# Patient Record
Sex: Female | Born: 1960
Health system: Southern US, Community
[De-identification: ages and names within clinical notes are randomized; demographics above are authoritative.]

## PROBLEM LIST (undated history)

## (undated) DIAGNOSIS — D649 Anemia, unspecified: Secondary | ICD-10-CM

## (undated) DIAGNOSIS — B009 Herpesviral infection, unspecified: Secondary | ICD-10-CM

## (undated) DIAGNOSIS — F32A Depression, unspecified: Secondary | ICD-10-CM

## (undated) DIAGNOSIS — M1990 Unspecified osteoarthritis, unspecified site: Secondary | ICD-10-CM

## (undated) DIAGNOSIS — F419 Anxiety disorder, unspecified: Secondary | ICD-10-CM

## (undated) HISTORY — PX: CHOLECYSTECTOMY: SHX55

## (undated) HISTORY — PX: LEG SURGERY: SHX1003

---

## 2016-10-25 ENCOUNTER — Encounter (HOSPITAL_COMMUNITY): Payer: Self-pay | Admitting: Emergency Medicine

## 2016-10-25 ENCOUNTER — Emergency Department (HOSPITAL_COMMUNITY): Payer: Commercial Managed Care - PPO

## 2016-10-25 ENCOUNTER — Emergency Department (HOSPITAL_COMMUNITY)
Admission: EM | Admit: 2016-10-25 | Discharge: 2016-10-25 | Disposition: A | Discharge status: Home / Self Care | Payer: Commercial Managed Care - PPO | Attending: Emergency Medicine | Admitting: Emergency Medicine

## 2016-10-25 DIAGNOSIS — J069 Acute upper respiratory infection, unspecified: Secondary | ICD-10-CM | POA: Diagnosis not present

## 2016-10-25 DIAGNOSIS — R05 Cough: Secondary | ICD-10-CM | POA: Diagnosis present

## 2016-10-25 NOTE — ED Provider Notes (Signed)
[facility] DEPT Provider Note   CSN: [id number] Arrival date & time: 10/25/16  1437     History   Chief Complaint Chief Complaint  Patient presents with  . Nasal Congestion    HPI Felicia Daniel is a 55 y.o. female.  HPI 55 year old female who presents with cough and congestion. No significant PMH. Works at [employer] and exposed to sick contacts. Yesterday, developed cough productive of thick mucous, sinus pressure and congestion, nasal congestion and drainage and left ear pain. No fever or chills, nausea or vomiting, diarrhea, abdominal pain or chest pain. Has not tried medications prior to arrival.  History reviewed. No pertinent past medical history.  There are no active problems to display for this patient.   Past Surgical History:  Procedure Laterality Date  . LEG SURGERY      OB History    No data available       Home Medications    Prior to Admission medications   Medication Sig Start Date End Date Taking? Authorizing Provider  Throat Lozenges (COUGH DROPS MT) Use as directed 1 drop in the mouth or throat as needed.   Yes Historical Provider, MD    Family History No family history on file. Reviewed. Non-contributory.   Social History Social History  Substance Use Topics  . Smoking status: Never Smoker  . Smokeless tobacco: Never Used  . Alcohol use No     Allergies   Review of patient's allergies indicates not on file.   Review of Systems Review of Systems  Constitutional: Negative for fever.  HENT: Positive for congestion, ear pain, rhinorrhea, sinus pressure and sore throat.   Respiratory: Positive for cough.   Cardiovascular: Negative for chest pain.  Gastrointestinal: Negative for vomiting.  All other systems reviewed and are negative.    Physical Exam Updated Vital Signs BP 134/87 (BP Location: Left Arm)   Pulse 80   Temp 98.2 F (36.8 C) (Oral)   Resp 20   Ht 5\' 4"  (1.626 m)   Wt 170 lb (77.1 kg)   LMP  10/21/2016   SpO2 98%   BMI 29.18 kg/m   Physical Exam Physical Exam  Nursing note and vitals reviewed. Constitutional: Well developed, well nourished, non-toxic, and in no acute distress Head: Normocephalic and atraumatic. Pain to percussion of bilateral frontal and maxillary sinuses. Ears: Left TM bulging with middle ear clear effusion. Normal right ear.  Mouth/Throat: Oropharynx is clear and moist.  Neck: Normal range of motion. Neck supple.  Cardiovascular: Normal rate and regular rhythm.   Pulmonary/Chest: Effort normal and breath sounds normal.  Abdominal: Soft. There is no tenderness. There is no rebound and no guarding.  Musculoskeletal: Normal range of motion.  Neurological: Alert, no facial droop, fluent speech, moves all extremities symmetrically Skin: Skin is warm and dry.  Psychiatric: Cooperative    ED Treatments / Results  Labs (all labs ordered are listed, but only abnormal results are displayed) Labs Reviewed - No data to display  EKG  EKG Interpretation None       Radiology Dg Chest 2 View  Result Date: 10/25/2016 CLINICAL DATA:  One day history of cough and chest congestion with sinus pressure. EXAM: CHEST  2 VIEW COMPARISON:  None in PACs FINDINGS: The lungs are well-expanded and clear. The heart and pulmonary vascularity are normal. The mediastinum is normal in width. There is no pleural effusion or pneumothorax. There is very mild anterior wedging of the body of T12. IMPRESSION:  There is no active cardiopulmonary disease. Mild loss of height of the body of [id number] which is of uncertain age. Electronically Signed   By: David  Martinique M.D.   On: 10/25/2016 15:39    Procedures Procedures (including critical care time)  Medications Ordered in ED Medications - No data to display   Initial Impression / Assessment and Plan / ED Course  I have reviewed the triage vital signs and the nursing notes.  Pertinent labs & imaging results that were available during  my care of the patient were reviewed by me and considered in my medical decision making (see chart for details).  Clinical Course   55 year old female who presents with likely viral upper respiratory infection. CXR Visualized and without any acute cardiopulmonary processes. Discussed supportive care instructions for decongestants and OTC analgesics. Strict return and follow-up instructions reviewed. She expressed understanding of all discharge instructions and felt comfortable with the plan of care.   Final Clinical Impressions(s) / ED Diagnoses   Final diagnoses:  Viral upper respiratory infection    New Prescriptions New Prescriptions   No medications on file     Forde Dandy, MD 10/25/16 1558

## 2016-10-25 NOTE — Discharge Instructions (Signed)
Your Chest x-ray does show pneumonia. He likely has a viral respiratory illness. Please follow up closely with her primary care doctor. Take over-the-counter Tylenol or ibuprofen for pain and nasal decongestants as needed. Return without fail for worsening symptoms, including confusion, difficulty breathing, escalating pain, or any other symptoms concerning to you.

## 2016-10-25 NOTE — ED Triage Notes (Signed)
Onset last night, facial pain, cough with brown mucus, sore throat and left ear pain

## 2019-06-29 ENCOUNTER — Telehealth: Payer: Self-pay | Admitting: Orthopaedic Surgery

## 2019-06-29 NOTE — Telephone Encounter (Signed)
Patient called and left voice message regarding scheduling an appointment.  Called back to number left on message, ph# [phone]; patient has no voice mail set up; unable to leave message.

## 2019-06-30 NOTE — Telephone Encounter (Signed)
No return call as of today.

## 2019-07-25 ENCOUNTER — Emergency Department (HOSPITAL_COMMUNITY)
Admission: EM | Admit: 2019-07-25 | Discharge: 2019-07-26 | Disposition: A | Discharge status: Home / Self Care | Payer: Commercial Managed Care - PPO | Attending: Emergency Medicine | Admitting: Emergency Medicine

## 2019-07-25 ENCOUNTER — Encounter (HOSPITAL_COMMUNITY): Payer: Self-pay | Admitting: Emergency Medicine

## 2019-07-25 ENCOUNTER — Emergency Department (HOSPITAL_COMMUNITY): Payer: Commercial Managed Care - PPO

## 2019-07-25 ENCOUNTER — Other Ambulatory Visit: Payer: Self-pay

## 2019-07-25 DIAGNOSIS — R509 Fever, unspecified: Secondary | ICD-10-CM

## 2019-07-25 DIAGNOSIS — Z20828 Contact with and (suspected) exposure to other viral communicable diseases: Secondary | ICD-10-CM | POA: Insufficient documentation

## 2019-07-25 DIAGNOSIS — N3000 Acute cystitis without hematuria: Secondary | ICD-10-CM

## 2019-07-25 HISTORY — DX: Herpesviral infection, unspecified: B00.9

## 2019-07-25 LAB — CBC WITH DIFFERENTIAL/PLATELET
Abs Immature Granulocytes: 0.05 10*3/uL (ref 0.00–0.07)
Basophils Absolute: 0 10*3/uL (ref 0.0–0.1)
Basophils Relative: 0 %
Eosinophils Absolute: 0 10*3/uL (ref 0.0–0.5)
Eosinophils Relative: 0 %
HCT: 43 % (ref 36.0–46.0)
Hemoglobin: 13.9 g/dL (ref 12.0–15.0)
Immature Granulocytes: 0 %
Lymphocytes Relative: 11 %
Lymphs Abs: 1.5 10*3/uL (ref 0.7–4.0)
MCH: 27.8 pg (ref 26.0–34.0)
MCHC: 32.3 g/dL (ref 30.0–36.0)
MCV: 86 fL (ref 80.0–100.0)
Monocytes Absolute: 0.8 10*3/uL (ref 0.1–1.0)
Monocytes Relative: 6 %
Neutro Abs: 10.5 10*3/uL — ABNORMAL HIGH (ref 1.7–7.7)
Neutrophils Relative %: 83 %
Platelets: 191 10*3/uL (ref 150–400)
RBC: 5 MIL/uL (ref 3.87–5.11)
RDW: 12.8 % (ref 11.5–15.5)
WBC: 12.8 10*3/uL — ABNORMAL HIGH (ref 4.0–10.5)
nRBC: 0 % (ref 0.0–0.2)

## 2019-07-25 LAB — COMPREHENSIVE METABOLIC PANEL
ALT: 23 U/L (ref 0–44)
AST: 23 U/L (ref 15–41)
Albumin: 4 g/dL (ref 3.5–5.0)
Alkaline Phosphatase: 75 U/L (ref 38–126)
Anion gap: 9 (ref 5–15)
BUN: 9 mg/dL (ref 6–20)
CO2: 24 mmol/L (ref 22–32)
Calcium: 8.7 mg/dL — ABNORMAL LOW (ref 8.9–10.3)
Chloride: 101 mmol/L (ref 98–111)
Creatinine, Ser: 0.78 mg/dL (ref 0.44–1.00)
GFR calc Af Amer: >60 mL/min (ref >60)
GFR calc non Af Amer: >60 mL/min (ref >60)
Glucose, Bld: 108 mg/dL — ABNORMAL HIGH (ref 70–99)
Potassium: 3.2 mmol/L — ABNORMAL LOW (ref 3.5–5.1)
Sodium: 134 mmol/L — ABNORMAL LOW (ref 135–145)
Total Bilirubin: 0.6 mg/dL (ref 0.3–1.2)
Total Protein: 8.2 g/dL — ABNORMAL HIGH (ref 6.5–8.1)

## 2019-07-25 LAB — URINALYSIS, ROUTINE W REFLEX MICROSCOPIC
Bacteria, UA: NONE SEEN
Bilirubin Urine: NEGATIVE
Glucose, UA: NEGATIVE mg/dL
Hgb urine dipstick: NEGATIVE
Ketones, ur: 5 mg/dL — AB
Nitrite: NEGATIVE
Protein, ur: NEGATIVE mg/dL
Specific Gravity, Urine: 1.023 (ref 1.005–1.030)
pH: 5 (ref 5.0–8.0)

## 2019-07-25 LAB — SARS CORONAVIRUS 2 BY RT PCR (HOSPITAL ORDER, PERFORMED IN ~~LOC~~ HOSPITAL LAB): SARS Coronavirus 2: NEGATIVE

## 2019-07-25 LAB — LIPASE, BLOOD: Lipase: 26 U/L (ref 11–51)

## 2019-07-25 MED ORDER — SODIUM CHLORIDE 0.9 % IV BOLUS
1000.0000 mL | Freq: Once | INTRAVENOUS | Status: AC
  Administered 2019-07-25: 1000 mL via INTRAVENOUS

## 2019-07-25 MED ORDER — ACETAMINOPHEN 500 MG PO TABS
1000.0000 mg | ORAL_TABLET | Freq: Once | ORAL | Status: AC
  Administered 2019-07-25: 1000 mg via ORAL
  Filled 2019-07-25: qty 2

## 2019-07-25 MED ORDER — ONDANSETRON HCL 4 MG/2ML IJ SOLN
4.0000 mg | Freq: Once | INTRAMUSCULAR | Status: AC
  Administered 2019-07-25: 4 mg via INTRAVENOUS
  Filled 2019-07-25: qty 2

## 2019-07-25 MED ORDER — IOHEXOL 300 MG/ML  SOLN
100.0000 mL | Freq: Once | INTRAMUSCULAR | Status: AC | PRN
  Administered 2019-07-25: 100 mL via INTRAVENOUS

## 2019-07-25 NOTE — ED Provider Notes (Signed)
[facility] EMERGENCY DEPARTMENT Provider Note   CSN: [id number] Arrival date & time: 07/25/19  1735     History   Chief Complaint Chief Complaint  Patient presents with  . Fever    HPI TONEKA FULLEN is a 58 y.o. female.     HPI  58 y/o female - states she has had abd pain with vomiting X 1 and fevers / aches today - mild headache - no cough / sob or exertional sx - no covid exposures - she has had hx of chole - sx are persistent - nothing makes better or worse - gradually worsening - no meds pta.  No urinary sx, no diarrhea.  Past Medical History:  Diagnosis Date  . Herpes     There are no active problems to display for this patient.   Past Surgical History:  Procedure Laterality Date  . CHOLECYSTECTOMY    . LEG SURGERY       OB History    Gravida  2   Para  2   Term  2   Preterm      AB      Living  0     SAB      TAB      Ectopic      Multiple      Live Births               Home Medications    Prior to Admission medications   Medication Sig Start Date End Date Taking? Authorizing Provider  Throat Lozenges (COUGH DROPS MT) Use as directed 1 drop in the mouth or throat as needed.    [provider]    Family History History reviewed. No pertinent family history.  Social History Social History   Tobacco Use  . Smoking status: Never Smoker  . Smokeless tobacco: Never Used  Substance Use Topics  . Alcohol use: No  . Drug use: Never     Allergies   Dilaudid [hydromorphone]   Review of Systems Review of Systems  All other systems reviewed and are negative.    Physical Exam Updated Vital Signs BP (S) (!) 142/104 (BP Location: Right Arm)   Pulse 87   Temp (!) 100.4 F (38 C) (Oral)   Resp 18   Ht 1.626 m (5\' 4" )   Wt 77.1 kg   LMP 10/21/2016   SpO2 98%   BMI 29.18 kg/m   Physical Exam Vitals signs and nursing note reviewed.  Constitutional:      General: She is not in acute distress.  Appearance: She is well-developed.  HENT:     Head: Normocephalic and atraumatic.     Mouth/Throat:     Pharynx: No oropharyngeal exudate.  Eyes:     General: No scleral icterus.       Right eye: No discharge.        Left eye: No discharge.     Conjunctiva/sclera: Conjunctivae normal.     Pupils: Pupils are equal, round, and reactive to light.  Neck:     Musculoskeletal: Normal range of motion and neck supple.     Thyroid: No thyromegaly.     Vascular: No JVD.  Cardiovascular:     Rate and Rhythm: Normal rate and regular rhythm.     Heart sounds: Normal heart sounds. No murmur. No friction rub. No gallop.   Pulmonary:     Effort: Pulmonary effort is normal. No respiratory distress.     Breath sounds: Normal  breath sounds. No wheezing or rales.  Abdominal:     General: Bowel sounds are normal. There is no distension.     Palpations: Abdomen is soft. There is no mass.     Tenderness: There is abdominal tenderness ( RLQ ttp with mild guarding, non peritoneal).  Musculoskeletal: Normal range of motion.        General: No tenderness.  Lymphadenopathy:     Cervical: No cervical adenopathy.  Skin:    General: Skin is warm and dry.     Findings: No erythema or rash.  Neurological:     Mental Status: She is alert.     Coordination: Coordination normal.  Psychiatric:        Behavior: Behavior normal.      ED Treatments / Results  Labs (all labs ordered are listed, but only abnormal results are displayed) Labs Reviewed  URINE CULTURE  SARS CORONAVIRUS 2 (HOSPITAL ORDER, [facility])  CBC WITH DIFFERENTIAL/PLATELET  COMPREHENSIVE METABOLIC PANEL  LIPASE, BLOOD  URINALYSIS, ROUTINE W REFLEX MICROSCOPIC    EKG None  Radiology No results found.  Procedures Procedures (including critical care time)  Medications Ordered in ED Medications  ondansetron (ZOFRAN) injection 4 mg (has no administration in time range)  sodium chloride 0.9 % bolus  1,000 mL (has no administration in time range)     Initial Impression / Assessment and Plan / ED Course  I have reviewed the triage vital signs and the nursing notes.  Pertinent labs & imaging results that were available during my care of the patient were reviewed by me and considered in my medical decision making (see chart for details).       Low grade temp -  Fever to 100.4 No tachycarida RLQ ttp - no urinary sx No other covid sx and no resp sx Fluids, meds, npo, pt agreeable. WBC elevated, Change of shift - care signed out to Dr. Eliane Decree to f/u results and disposition accordingly.   Final Clinical Impressions(s) / ED Diagnoses   Final diagnoses:  None    ED Discharge Orders    None       Noemi Chapel, MD 07/25/19 2310

## 2019-07-25 NOTE — ED Triage Notes (Signed)
Patient c/o sudden onset of fever, chills, body ache, facial pressure, chest congestion, and nausea that started this morning at 9:30am. Patient reports taking cold and congestion pill at 11am with no improvement. Per patient symptoms increasingly getting worse. Denies any cough.

## 2019-07-26 MED ORDER — SODIUM CHLORIDE 0.9 % IV SOLN
1.0000 g | Freq: Once | INTRAVENOUS | Status: AC
  Administered 2019-07-26: 1 g via INTRAVENOUS
  Filled 2019-07-26: qty 10

## 2019-07-26 MED ORDER — CEPHALEXIN 500 MG PO CAPS: 500.0000 mg | ORAL_CAPSULE | Freq: Three times a day (TID) | ORAL | 0 refills | Status: DC

## 2019-07-26 MED ORDER — ONDANSETRON HCL 4 MG PO TABS: 4.0000 mg | ORAL_TABLET | Freq: Three times a day (TID) | ORAL | 0 refills | Status: DC | PRN

## 2019-07-26 NOTE — ED Provider Notes (Signed)
Patient's disposition had been set to left without being seen.  Patient left at change of shift to get results of CT scan.  Patient states she is feeling better, she states her pain is in the right lower quadrant.  She did have some fever when she arrived in the ED.  She denies cough, diarrhea, but did have vomiting once.  We reviewed her test results and her she does have an elevated white blood cell count and her urine has negative nitrates but positive leukocytes with 11-20 white blood cells but also 11-20 squamous cells.  Her CT scan describes some air in the bladder however she denies having a urinary catheter done, she states she voided in a cup on her own.  I think because of this we will start her on antibiotics for a urinary tract infection.  She was given IV Rocephin.  Ct Abdomen Pelvis W Contrast  Result Date: 07/25/2019 CLINICAL DATA:  Fever with nausea and vomiting EXAM: CT ABDOMEN AND PELVIS WITH CONTRAST TECHNIQUE: Multidetector CT imaging of the abdomen and pelvis was performed using the standard protocol following bolus administration of intravenous contrast. CONTRAST:  130mL OMNIPAQUE IOHEXOL 300 MG/ML  SOLN COMPARISON:  None. FINDINGS: Lower chest: No acute abnormality. Hepatobiliary: No focal liver abnormality is seen. Status post cholecystectomy. No biliary dilatation. Pancreas: Unremarkable. No pancreatic ductal dilatation or surrounding inflammatory changes. Spleen: Normal in size without focal abnormality. Adrenals/Urinary Tract: Adrenal glands are within normal limits bilaterally. The kidneys are well visualized within normal enhancement pattern. Normal delayed excretion of contrast material is noted. No obstructive changes are noted. The bladder is partially distended. A few tiny foci of air are noted within the bladder which may be related to recent instrumentation. Clinical correlation is recommended. Stomach/Bowel: The appendix is well visualized and air filled. No inflammatory  changes are seen. No obstructive or inflammatory changes of large or small bowel are noted. The stomach is within normal limits with the exception of a small sliding-type hiatal hernia. Vascular/Lymphatic: Aortic atherosclerosis. No enlarged abdominal or pelvic lymph nodes. Reproductive: Uterus and bilateral adnexa are unremarkable. Other: No abdominal wall hernia or abnormality. No abdominopelvic ascites. Musculoskeletal: No acute or significant osseous findings. IMPRESSION: Normal-appearing appendix. Tiny foci of air within the bladder likely related to recent instrumentation. Electronically Signed   By: Inez Catalina M.D.   On: 07/25/2019 23:46   Medications  cefTRIAXone (ROCEPHIN) 1 g in sodium chloride 0.9 % 100 mL IVPB (has no administration in time range)  ondansetron (ZOFRAN) injection 4 mg (4 mg Intravenous Given 07/25/19 2231)  sodium chloride 0.9 % bolus 1,000 mL (0 mLs Intravenous Stopped 07/25/19 2326)  acetaminophen (TYLENOL) tablet 1,000 mg (1,000 mg Oral Given 07/25/19 2230)  iohexol (OMNIPAQUE) 300 MG/ML solution 100 mL (100 mLs Intravenous Contrast Given 07/25/19 2330)    Diagnoses that have been ruled out:  None  Diagnoses that are still under consideration:  None  Final diagnoses:  Fever, unspecified fever cause  Acute cystitis without hematuria   ED Discharge Orders         Ordered    cephALEXin (KEFLEX) 500 MG capsule  3 times daily     07/26/19 0220    ondansetron (ZOFRAN) 4 MG tablet  Every 8 hours PRN     07/26/19 0221         Plan discharge  Rolland Porter, MD, Barbette Or, MD 07/26/19 Rogene Houston

## 2019-07-26 NOTE — Discharge Instructions (Addendum)
Try to drink plenty of fluids.  Take acetaminophen 650 mg and/or ibuprofen 600 mg every 6 hours as needed for fever or pain.  Take the antibiotics until gone.  Return to the emergency department if you have uncontrollable vomiting or you feel worse.

## 2019-07-27 LAB — URINE CULTURE: Culture: <10000 — AB

## 2020-03-13 ENCOUNTER — Other Ambulatory Visit: Payer: Self-pay

## 2020-03-13 ENCOUNTER — Ambulatory Visit (INDEPENDENT_AMBULATORY_CARE_PROVIDER_SITE_OTHER): Payer: Commercial Managed Care - PPO | Admitting: Dermatology

## 2020-03-13 DIAGNOSIS — D229 Melanocytic nevi, unspecified: Secondary | ICD-10-CM

## 2020-03-13 DIAGNOSIS — D225 Melanocytic nevi of trunk: Secondary | ICD-10-CM | POA: Diagnosis not present

## 2020-03-13 DIAGNOSIS — D2371 Other benign neoplasm of skin of right lower limb, including hip: Secondary | ICD-10-CM | POA: Diagnosis not present

## 2020-03-13 DIAGNOSIS — D2372 Other benign neoplasm of skin of left lower limb, including hip: Secondary | ICD-10-CM | POA: Diagnosis not present

## 2020-03-13 DIAGNOSIS — D1801 Hemangioma of skin and subcutaneous tissue: Secondary | ICD-10-CM | POA: Diagnosis not present

## 2020-03-13 NOTE — Progress Notes (Addendum)
   New Patient   Subjective  Felicia Daniel is a 59 y.o. female who presents for the following: Skin Problem (Left and Rght Leg-raised).  growths Location: legs Duration: 1+year Quality: stable Associated Signs/Symptoms: Modifying Factors:  Severity:  Timing: Context:    The following portions of the chart were reviewed this encounter and updated as appropriate:     Objective  Well appearing patient in no apparent distress; mood and affect are within normal limits.  All sun exposed areas plus back examined.Plus legs. Sun exposed areas of the head neck, back, and lower legs were examined.  There is no sign of melanoma or nonmelanoma skin cancer.  There is a monochrome brown 6 mm slightly irregularly shaped nevus on the right upper back; dermoscopy is normal.  On the left upper back is a 4 mm cherry angioma.  On each lower leg is a firm 37mm dermal papule which is pink with a brown halo.  These represent typical benign dermatofibroma's and currently requiring no intervention.  Follow-up can be on a as needed basis.  Assessment & Plan  Nevus (2) Left Lower Leg - Posterior; Right Lower Leg - Posterior  Recheck as needed change

## 2020-03-15 ENCOUNTER — Ambulatory Visit: Payer: Commercial Managed Care - PPO | Attending: Internal Medicine

## 2020-03-15 ENCOUNTER — Other Ambulatory Visit: Payer: Self-pay

## 2020-03-15 DIAGNOSIS — Z20822 Contact with and (suspected) exposure to covid-19: Secondary | ICD-10-CM

## 2020-03-16 LAB — SARS-COV-2, NAA 2 DAY TAT

## 2020-03-16 LAB — NOVEL CORONAVIRUS, NAA: SARS-CoV-2, NAA: NOT DETECTED

## 2020-07-15 ENCOUNTER — Ambulatory Visit: Payer: Commercial Managed Care - PPO | Attending: Internal Medicine

## 2020-07-15 DIAGNOSIS — Z23 Encounter for immunization: Secondary | ICD-10-CM

## 2020-07-15 NOTE — Progress Notes (Signed)
   Covid-19 Vaccination Clinic  Name:  AMMARIE MATSUURA    MRN: [id number] DOB: 04-01-1961  07/15/2020  Ms. Provencal was observed post Covid-19 immunization for 15 minutes without incident. She was provided with Vaccine Information Sheet and instruction to access the V-Safe system.   Ms. Luster was instructed to call 911 with any severe reactions post vaccine: Marland Kitchen Difficulty breathing  . Swelling of face and throat  . A fast heartbeat  . A bad rash all over body  . Dizziness and weakness   Immunizations Administered    Name Date Dose VIS Date Route   Pfizer COVID-19 Vaccine 07/15/2020  8:17 AM 0.3 mL 02/16/2019 Intramuscular   Manufacturer: [employer], [employer]   Lot: [id number]   [place]: [id number]

## 2020-08-08 ENCOUNTER — Ambulatory Visit: Payer: Commercial Managed Care - PPO

## 2020-09-20 ENCOUNTER — Encounter: Payer: Self-pay | Admitting: *Deleted

## 2020-10-23 ENCOUNTER — Other Ambulatory Visit (HOSPITAL_COMMUNITY): Payer: Self-pay | Admitting: Internal Medicine

## 2020-10-23 DIAGNOSIS — Z1231 Encounter for screening mammogram for malignant neoplasm of breast: Secondary | ICD-10-CM

## 2020-11-06 ENCOUNTER — Ambulatory Visit (INDEPENDENT_AMBULATORY_CARE_PROVIDER_SITE_OTHER): Payer: Self-pay | Admitting: *Deleted

## 2020-11-06 ENCOUNTER — Other Ambulatory Visit: Payer: Self-pay

## 2020-11-06 VITALS — Ht 64.0 in | Wt 178.2 lb

## 2020-11-06 DIAGNOSIS — Z1211 Encounter for screening for malignant neoplasm of colon: Secondary | ICD-10-CM

## 2020-11-06 MED ORDER — CLENPIQ 10-3.5-12 MG-GM -GM/160ML PO SOLN: 1.0000 | Freq: Once | ORAL | 0 refills | Status: AC

## 2020-11-06 NOTE — Patient Instructions (Addendum)
Felicia Daniel   06/16/1961 MRN: [id number]    Procedure Date: 11/10/2020 Time to register: 6:00 am Place to register: [facility] Procedure Time: 7:30 am Scheduled provider: Dr. Abbey Chatters  PREPARATION FOR COLONOSCOPY WITH TRI-LYTE SPLIT PREP  Please notify us immediately if you are diabetic, take iron supplements, or if you are on Coumadin or any other blood thinners.   Please hold the following medications: n/a  You will need to purchase 1 fleet enema and 1 box of Bisacodyl 23m tablets.   2 DAYS BEFORE PROCEDURE:  DATE: 11/08/2020   DAY: Wednesday Begin clear liquid diet AFTER your lunch meal. NO SOLID FOODS after this point.  1 DAY BEFORE PROCEDURE:  DATE: 11/09/2020   DAY: Thursday Continue clear liquids the entire day - NO SOLID FOOD.   Diabetic medications adjustments for today: n/a  At 2:00 pm:  Take 2 Bisacodyl tablets.   At 4:00pm:  Start drinking your solution. Make sure you mix well per instructions on the bottle. Try to drink 1 (one) 8 ounce glass every 10-15 minutes until you have consumed HALF the jug. You should complete by 6:00pm.You must keep the left over solution refrigerated until completed next day.  Continue clear liquids. You must drink plenty of clear liquids to prevent dehyration and kidney failure.     DAY OF PROCEDURE:   DATE: 11/10/2020   DAY: Friday If you take medications for your heart, blood pressure or breathing, you may take these medications.  Diabetic medications adjustments for today: n/a  Five hours before your procedure time @ 2:30 am:  Finish remaining amout of bowel prep, drinking 1 (one) 8 ounce glass every 10-15 minutes until complete. You have two hours to consume remaining prep.   Three hours before your procedure time @ 4:30 am:  Nothing by mouth.   At least one hour before going to the hospital:  Give yourself one Fleet enema. You may take your morning medications with sip of water unless we have instructed otherwise.       Please see below for Dietary Information.  CLEAR LIQUIDS INCLUDE:  Water Jello (NOT red in color)   Ice Popsicles (NOT red in color)   Tea (sugar ok, no milk/cream) Powdered fruit flavored drinks  Coffee (sugar ok, no milk/cream) Gatorade/ Lemonade/ Kool-Aid  (NOT red in color)   Juice: apple, white grape, white cranberry Soft drinks  Clear bullion, consomme, broth (fat free beef/chicken/vegetable)  Carbonated beverages (any kind)  Strained chicken noodle soup Hard Candy   Remember: Clear liquids are liquids that will allow you to see your fingers on the other side of a clear glass. Be sure liquids are NOT red in color, and not cloudy, but CLEAR.  DO NOT EAT OR DRINK ANY OF THE FOLLOWING:  Dairy products of any kind   Cranberry juice Tomato juice / V8 juice   Grapefruit juice Orange juice     Red grape juice  Do not eat any solid foods, including such foods as: cereal, oatmeal, yogurt, fruits, vegetables, creamed soups, eggs, bread, crackers, pureed foods in a blender, etc.   HELPFUL HINTS FOR DRINKING PREP SOLUTION:   Make sure prep is extremely cold. Mix and refrigerate the the morning of the prep. You may also put in the freezer.   You may try mixing some Crystal Light or Country Time Lemonade if you prefer. Mix in small amounts; add more if necessary.  Try drinking through a straw  Rinse mouth with water  or a mouthwash between glasses, to remove after-taste.  Try sipping on a cold beverage /ice/ popsicles between glasses of prep.  Place a piece of sugar-free hard candy in mouth between glasses.  If you become nauseated, try consuming smaller amounts, or stretch out the time between glasses. Stop for 30-60 minutes, then slowly start back drinking.        OTHER INSTRUCTIONS  You will need a responsible adult at least 58 years of age to accompany you and drive you home. This person must remain in the waiting room during your procedure. The hospital will cancel  your procedure if you do not have a responsible adult with you.   1. Wear loose fitting clothing that is easily removed. 2. Leave jewelry and other valuables at home.  3. Remove all body piercing jewelry and leave at home. 4. Total time from sign-in until discharge is approximately 2-3 hours. 5. You should go home directly after your procedure and rest. You can resume normal activities the day after your procedure. 6. The day of your procedure you should not:  Drive  Make legal decisions  Operate machinery  Drink alcohol  Return to work   You may call the office (Dept: [phone]) before 5:00pm, or page the doctor on call [phone]) after 5:00pm, for further instructions, if necessary.   Insurance Information YOU WILL NEED TO CHECK WITH YOUR INSURANCE COMPANY FOR THE BENEFITS OF COVERAGE YOU HAVE FOR THIS PROCEDURE.  UNFORTUNATELY, NOT ALL INSURANCE COMPANIES HAVE BENEFITS TO COVER ALL OR PART OF THESE TYPES OF PROCEDURES.  IT IS YOUR RESPONSIBILITY TO CHECK YOUR BENEFITS, HOWEVER, WE WILL BE GLAD TO ASSIST YOU WITH ANY CODES YOUR INSURANCE COMPANY MAY NEED.    PLEASE NOTE THAT MOST INSURANCE COMPANIES WILL NOT COVER A SCREENING COLONOSCOPY FOR PEOPLE UNDER THE AGE OF 50  IF YOU HAVE BCBS INSURANCE, YOU MAY HAVE BENEFITS FOR A SCREENING COLONOSCOPY BUT IF POLYPS ARE FOUND THE DIAGNOSIS WILL CHANGE AND THEN YOU MAY HAVE A DEDUCTIBLE THAT WILL NEED TO BE MET. SO PLEASE MAKE SURE YOU CHECK YOUR BENEFITS FOR A SCREENING COLONOSCOPY AS WELL AS A DIAGNOSTIC COLONOSCOPY.

## 2020-11-06 NOTE — Progress Notes (Addendum)
Gastroenterology Pre-Procedure Review  Request Date: 11/06/2020 Requesting Physician: Dr. Wende Neighbors, no previous TCS  PATIENT REVIEW QUESTIONS: The patient responded to the following health history questions as indicated:    1. Diabetes Melitis: no 2. Joint replacements in the past 12 months: no 3. Major health problems in the past 3 months: no 4. Has an artificial valve or MVP: no 5. Has a defibrillator: no 6. Has been advised in past to take antibiotics in advance of a procedure like teeth cleaning: no 7. Family history of colon cancer: no 8. Alcohol Use: no 9. Illicit drug Use: no 10. History of sleep apnea: no  11. History of coronary artery or other vascular stents placed within the last 12 months: no 12. History of any prior anesthesia complications: no 13. Body mass index is 30.59 kg/m.    MEDICATIONS & ALLERGIES:    Patient reports the following regarding taking any blood thinners:   Plavix? no Aspirin? no Coumadin? no Brilinta? no Xarelto? no Eliquis? no Pradaxa? no Savaysa? no Effient? no  Patient confirms/reports the following medications:  Current Outpatient Medications  Medication Sig Dispense Refill   Omega-3 1000 MG CAPS Take by mouth daily.     valACYclovir (VALTREX) 1000 MG tablet Take 1,000 mg by mouth daily.     No current facility-administered medications for this visit.    Patient confirms/reports the following allergies:  Allergies  Allergen Reactions   Dilaudid [Hydromorphone] Itching and Rash    "skin coming off"    No orders of the defined types were placed in this encounter.   AUTHORIZATION INFORMATION Primary Insurance: [place],  [place] #: [id number],  Group #: [id number] Pre-Cert / Josem Kaufmann required: No, not required per Gar Ponto / Auth #: Ref#: [id number]  SCHEDULE INFORMATION: Procedure has been scheduled as follows:  Date: 11/10/2020, Time: 7:30 Location: [facility] with Dr. Abbey Chatters  This Gastroenterology Pre-Precedure Review Form is  being routed to the following provider(s): Aliene Altes, PA

## 2020-11-07 ENCOUNTER — Telehealth: Payer: Self-pay | Admitting: Internal Medicine

## 2020-11-07 ENCOUNTER — Other Ambulatory Visit: Payer: Self-pay | Admitting: *Deleted

## 2020-11-07 MED ORDER — PEG 3350-KCL-NA BICARB-NACL 420 G PO SOLR: 4000.0000 mL | Freq: Once | ORAL | 0 refills | Status: AC

## 2020-11-07 NOTE — Telephone Encounter (Signed)
Patient called and said that her insurance does not cover the prep that was called in   Please send an alternative

## 2020-11-07 NOTE — Telephone Encounter (Signed)
Noted  

## 2020-11-07 NOTE — Addendum Note (Signed)
Addended by: Metro Kung on: 11/07/2020 10:54 AM   Modules accepted: Orders

## 2020-11-07 NOTE — Progress Notes (Signed)
Ok to schedule.  ASA II 

## 2020-11-07 NOTE — Telephone Encounter (Signed)
Spoke to [facility] and they recommended Golytely as an alternative.  They said it should be covered at a cost of $10 for pt.  Informed pt that I was sending new RX along with prep instructions.  Pt was advised to pick up fleet enema and dulcolax as well.  Routing to [employer], [place] as Juluis Rainier.

## 2020-11-08 ENCOUNTER — Other Ambulatory Visit: Payer: Self-pay

## 2020-11-08 ENCOUNTER — Other Ambulatory Visit (HOSPITAL_COMMUNITY)
Admission: RE | Admit: 2020-11-08 | Discharge: 2020-11-08 | Disposition: A | Discharge status: Home / Self Care | Payer: Commercial Managed Care - PPO | Source: Ambulatory Visit | Attending: Internal Medicine | Admitting: Internal Medicine

## 2020-11-08 DIAGNOSIS — Z01812 Encounter for preprocedural laboratory examination: Secondary | ICD-10-CM | POA: Insufficient documentation

## 2020-11-08 DIAGNOSIS — Z20822 Contact with and (suspected) exposure to covid-19: Secondary | ICD-10-CM | POA: Diagnosis not present

## 2020-11-09 LAB — SARS CORONAVIRUS 2 (TAT 6-24 HRS): SARS Coronavirus 2: NEGATIVE

## 2020-11-10 ENCOUNTER — Ambulatory Visit (HOSPITAL_COMMUNITY): Payer: Commercial Managed Care - PPO | Admitting: Anesthesiology

## 2020-11-10 ENCOUNTER — Ambulatory Visit (HOSPITAL_COMMUNITY)
Admission: RE | Admit: 2020-11-10 | Discharge: 2020-11-10 | Disposition: A | Discharge status: Home / Self Care | Payer: Commercial Managed Care - PPO | Attending: Internal Medicine | Admitting: Internal Medicine

## 2020-11-10 ENCOUNTER — Encounter (HOSPITAL_COMMUNITY): Payer: Self-pay

## 2020-11-10 ENCOUNTER — Encounter (HOSPITAL_COMMUNITY)
Admission: RE | Disposition: A | Discharge status: Home / Self Care | Payer: Self-pay | Source: Home / Self Care | Attending: Internal Medicine

## 2020-11-10 DIAGNOSIS — K635 Polyp of colon: Secondary | ICD-10-CM

## 2020-11-10 DIAGNOSIS — K648 Other hemorrhoids: Secondary | ICD-10-CM | POA: Insufficient documentation

## 2020-11-10 DIAGNOSIS — Z1211 Encounter for screening for malignant neoplasm of colon: Secondary | ICD-10-CM | POA: Diagnosis present

## 2020-11-10 DIAGNOSIS — D123 Benign neoplasm of transverse colon: Secondary | ICD-10-CM | POA: Diagnosis not present

## 2020-11-10 DIAGNOSIS — Z79899 Other long term (current) drug therapy: Secondary | ICD-10-CM | POA: Insufficient documentation

## 2020-11-10 DIAGNOSIS — D125 Benign neoplasm of sigmoid colon: Secondary | ICD-10-CM | POA: Diagnosis not present

## 2020-11-10 HISTORY — DX: Depression, unspecified: F32.A

## 2020-11-10 HISTORY — PX: COLONOSCOPY WITH PROPOFOL: SHX5780

## 2020-11-10 HISTORY — DX: Anxiety disorder, unspecified: F41.9

## 2020-11-10 HISTORY — PX: POLYPECTOMY: SHX5525

## 2020-11-10 SURGERY — COLONOSCOPY WITH PROPOFOL
Anesthesia: General

## 2020-11-10 MED ORDER — LIDOCAINE HCL (CARDIAC) PF 100 MG/5ML IV SOSY
PREFILLED_SYRINGE | INTRAVENOUS | Status: DC | PRN
  Administered 2020-11-10: 100 mg via INTRAVENOUS

## 2020-11-10 MED ORDER — PROPOFOL 10 MG/ML IV BOLUS
INTRAVENOUS | Status: AC
  Filled 2020-11-10: qty 120

## 2020-11-10 MED ORDER — LACTATED RINGERS IV SOLN: INTRAVENOUS | Status: DC | PRN

## 2020-11-10 MED ORDER — LACTATED RINGERS IV SOLN
Freq: Once | INTRAVENOUS | Status: AC
  Administered 2020-11-10: 1000 mL via INTRAVENOUS

## 2020-11-10 MED ORDER — LIDOCAINE 2% (20 MG/ML) 5 ML SYRINGE
INTRAMUSCULAR | Status: AC
  Filled 2020-11-10: qty 50

## 2020-11-10 MED ORDER — CHLORHEXIDINE GLUCONATE CLOTH 2 % EX PADS: 6.0000 | MEDICATED_PAD | Freq: Once | CUTANEOUS | Status: DC

## 2020-11-10 MED ORDER — PROPOFOL 10 MG/ML IV BOLUS
INTRAVENOUS | Status: AC
  Filled 2020-11-10: qty 320

## 2020-11-10 MED ORDER — PROPOFOL 10 MG/ML IV BOLUS
INTRAVENOUS | Status: DC | PRN
  Administered 2020-11-10: 150 ug/kg/min via INTRAVENOUS
  Administered 2020-11-10: 100 mg via INTRAVENOUS

## 2020-11-10 NOTE — Anesthesia Preprocedure Evaluation (Addendum)
Anesthesia Evaluation  Patient identified by MRN, date of birth, ID band Patient awake    Reviewed: Allergy & Precautions, NPO status , Patient's Chart, lab work & pertinent test results  History of Anesthesia Complications Negative for: history of anesthetic complications  Airway Mallampati: II  TM Distance: >3 FB Neck ROM: Full    Dental  (+) Dental Advisory Given   Pulmonary neg pulmonary ROS,    Pulmonary exam normal breath sounds clear to auscultation       Cardiovascular Exercise Tolerance: Good negative cardio ROS Normal cardiovascular exam Rhythm:Regular Rate:Normal     Neuro/Psych PSYCHIATRIC DISORDERS Anxiety Depression negative neurological ROS     GI/Hepatic negative GI ROS, Neg liver ROS,   Endo/Other  negative endocrine ROS  Renal/GU negative Renal ROS     Musculoskeletal negative musculoskeletal ROS (+)   Abdominal Normal abdominal exam  (+)   Peds  Hematology negative hematology ROS (+)   Anesthesia Other Findings   Reproductive/Obstetrics negative OB ROS                            Anesthesia Physical Anesthesia Plan  ASA: II  Anesthesia Plan: General   Post-op Pain Management:    Induction: Intravenous  PONV Risk Score and Plan: TIVA and Propofol infusion  Airway Management Planned: Nasal Cannula and Natural Airway  Additional Equipment:   Intra-op Plan:   Post-operative Plan:   Informed Consent: I have reviewed the patients History and Physical, chart, labs and discussed the procedure including the risks, benefits and alternatives for the proposed anesthesia with the patient or authorized representative who has indicated his/her understanding and acceptance.     Dental advisory given  Plan Discussed with: CRNA and Surgeon  Anesthesia Plan Comments:        Anesthesia Quick Evaluation

## 2020-11-10 NOTE — Transfer of Care (Signed)
Immediate Anesthesia Transfer of Care Note  Patient: Felicia Daniel  Procedure(s) Performed: COLONOSCOPY WITH PROPOFOL (N/A ) POLYPECTOMY  Patient Location: Endoscopy Unit  Anesthesia Type:General  Level of Consciousness: awake, alert , oriented and patient cooperative  Airway & Oxygen Therapy: Patient Spontanous Breathing  Post-op Assessment: Report given to RN, Post -op Vital signs reviewed and stable and Patient moving all extremities  Post vital signs: Reviewed and stable  Last Vitals:  Vitals Value Taken Time  BP    Temp    Pulse 64 11/10/20 0759  Resp    SpO2      Last Pain:  Vitals:   11/10/20 0759  TempSrc: Oral  PainSc:       Patients Stated Pain Goal: 5 (74/14/23 9532)  Complications: No complications documented.

## 2020-11-10 NOTE — Op Note (Signed)
[facility] Patient Name: Felicia Daniel Procedure Date: 11/10/2020 7:17 AM MRN: [id number] Date of Birth: 11-29-1961 Attending MD: Elon Alas. Edgar Frisk CSN: [id number] Age: 59 Admit Type: Outpatient Procedure:                Colonoscopy Indications:              Screening for colorectal malignant neoplasm Providers:                Elon Alas. Wendell Nicoson, DO, Otis Peak B. Sharon Seller, RN, Wynonia Musty Tech, Technician Referring MD:              Medicines:                See the Anesthesia note for documentation of the                            administered medications Complications:            No immediate complications. Estimated Blood Loss:     Estimated blood loss was minimal. Procedure:                Pre-Anesthesia Assessment:                           - The anesthesia plan was to use monitored                            anesthesia care (MAC).                           After obtaining informed consent, the colonoscope                            was passed under direct vision. Throughout the                            procedure, the patient's blood pressure, pulse, and                            oxygen saturations were monitored continuously. The                            [id number]-[id number] ([id number]) scope was introduced through                            the anus and advanced to the the cecum, identified                            by appendiceal orifice and ileocecal valve. The                            colonoscopy was performed without difficulty. The                            patient tolerated the  procedure well. The quality                            of the bowel preparation was evaluated using the                            BBPS [facility] Bowel Preparation Scale) with scores                            of: Right Colon = 3, Transverse Colon = 3 and Left                            Colon = 3 (entire mucosa seen well with no residual                             staining, small fragments of stool or opaque                            liquid). The total BBPS score equals 9. Scope In: 7:34:13 AM Scope Out: 7:55:40 AM Scope Withdrawal Time: 0 hours 15 minutes 6 seconds  Total Procedure Duration: 0 hours 21 minutes 27 seconds  Findings:      The perianal and digital rectal examinations were normal.      Non-bleeding internal hemorrhoids were found during endoscopy.      Three sessile polyps were found in the transverse colon. The polyps were       4 to 6 mm in size. These polyps were removed with a cold snare.       Resection and retrieval were complete.      A 12 mm polyp was found in the sigmoid colon. The polyp was       pedunculated. The polyp was removed with a hot snare. Resection and       retrieval were complete.      The exam was otherwise without abnormality. Impression:               - Non-bleeding internal hemorrhoids.                           - Three 4 to 6 mm polyps in the transverse colon,                            removed with a cold snare. Resected and retrieved.                           - One 12 mm polyp in the sigmoid colon, removed                            with a hot snare. Resected and retrieved.                           - The examination was otherwise normal. Moderate Sedation:      Per Anesthesia Care Recommendation:           - Patient has a contact number available for  emergencies. The signs and symptoms of potential                            delayed complications were discussed with the                            patient. Return to normal activities tomorrow.                            Written discharge instructions were provided to the                            patient.                           - Resume previous diet.                           - Continue present medications.                           - Await pathology results.                           - Repeat colonoscopy in 3 years  for surveillance.                           - Return to GI clinic PRN. Procedure Code(s):        --- Professional ---                           [phone], Colonoscopy, flexible; with removal of                            tumor(s), polyp(s), or other lesion(s) by snare                            technique Diagnosis Code(s):        --- Professional ---                           K63.5, Polyp of colon                           Z12.11, Encounter for screening for malignant                            neoplasm of colon                           K64.8, Other hemorrhoids CPT copyright 2019 American Medical Association. All rights reserved. The codes documented in this report are preliminary and upon coder review may  be revised to meet current compliance requirements. Elon Alas. Abbey Chatters, DO [place] Abbey Chatters, DO 11/10/2020 8:37:52 AM This report has been signed electronically. Number of Addenda: 0

## 2020-11-10 NOTE — Anesthesia Postprocedure Evaluation (Signed)
Anesthesia Post Note  Patient: Felicia Daniel  Procedure(s) Performed: COLONOSCOPY WITH PROPOFOL (N/A ) POLYPECTOMY  Patient location during evaluation: Endoscopy Anesthesia Type: General Level of consciousness: awake, oriented, awake and alert and patient cooperative Pain management: pain level controlled Vital Signs Assessment: post-procedure vital signs reviewed and stable Respiratory status: spontaneous breathing, respiratory function stable and nonlabored ventilation Cardiovascular status: stable and blood pressure returned to baseline Postop Assessment: no headache and no backache Anesthetic complications: no   No complications documented.   Last Vitals:  Vitals:   11/10/20 0646 11/10/20 0759  BP: 132/80   Pulse: 64 64  Resp: 17   Temp: 36.7 C   SpO2: 97%     Last Pain:  Vitals:   11/10/20 0759  TempSrc: Oral  PainSc:                  Tacy Learn

## 2020-11-10 NOTE — H&P (Signed)
Primary Care Physician:  Celene Squibb, MD Primary Gastroenterologist:  Dr. Abbey Chatters  Pre-Procedure History & Physical: HPI:  Felicia Daniel is a 59 y.o. female is here for a colonoscopy for colon cancer screening purposes.  Patient denies any family history of colorectal cancer.  No melena or hematochezia.  No abdominal pain or unintentional weight loss.  No change in bowel habits.  Overall feels well from a GI standpoint.  Past Medical History:  Diagnosis Date  . Anxiety   . Depression   . Herpes     Past Surgical History:  Procedure Laterality Date  . CHOLECYSTECTOMY    . LEG SURGERY      Prior to Admission medications   Medication Sig Start Date End Date Taking? Authorizing Provider  Omega-3 1000 MG CAPS Take 1,000 mg by mouth daily.    Yes [provider]  valACYclovir (VALTREX) 1000 MG tablet Take 1,000 mg by mouth daily. 07/17/20  Yes [provider]    Allergies as of 11/07/2020 - Review Complete 11/06/2020  Allergen Reaction Noted  . Dilaudid [hydromorphone] Itching and Rash 07/25/2019    History reviewed. No pertinent family history.  Social History   Socioeconomic History  . Marital status: Married    Spouse name: Not on file  . Number of children: Not on file  . Years of education: Not on file  . Highest education level: Not on file  Occupational History  . Not on file  Tobacco Use  . Smoking status: Never Smoker  . Smokeless tobacco: Never Used  Vaping Use  . Vaping Use: Never used  Substance and Sexual Activity  . Alcohol use: No  . Drug use: Never  . Sexual activity: Not on file  Other Topics Concern  . Not on file  Social History Narrative  . Not on file   Social Determinants of Health   Financial Resource Strain:   . Difficulty of Paying Living Expenses: Not on file  Food Insecurity:   . Worried About Charity fundraiser in the Last Year: Not on file  . Ran Out of Food in the Last Year: Not on file  Transportation Needs:    . Lack of Transportation (Medical): Not on file  . Lack of Transportation (Non-Medical): Not on file  Physical Activity:   . Days of Exercise per Week: Not on file  . Minutes of Exercise per Session: Not on file  Stress:   . Feeling of Stress : Not on file  Social Connections:   . Frequency of Communication with Friends and Family: Not on file  . Frequency of Social Gatherings with Friends and Family: Not on file  . Attends Religious Services: Not on file  . Active Member of Clubs or Organizations: Not on file  . Attends Archivist Meetings: Not on file  . Marital Status: Not on file  Intimate Partner Violence:   . Fear of Current or Ex-Partner: Not on file  . Emotionally Abused: Not on file  . Physically Abused: Not on file  . Sexually Abused: Not on file    Review of Systems: See HPI, otherwise negative ROS  Impression/Plan: Felicia Daniel is here for a colonoscopy to be performed for colon cancer screening purposes.  The risks of the procedure including infection, bleed, or perforation as well as benefits, limitations, alternatives and imponderables have been reviewed with the patient. Questions have been answered. All parties agreeable.

## 2020-11-10 NOTE — Discharge Instructions (Addendum)
Colonoscopy Discharge Instructions  Read the instructions outlined below and refer to this sheet in the next few weeks. These discharge instructions provide you with general information on caring for yourself after you leave the hospital. Your doctor may also give you specific instructions. While your treatment has been planned according to the most current medical practices available, unavoidable complications occasionally occur.   ACTIVITY  You may resume your regular activity, but move at a slower pace for the next 24 hours.   Take frequent rest periods for the next 24 hours.   Walking will help get rid of the air and reduce the bloated feeling in your belly (abdomen).   No driving for 24 hours (because of the medicine (anesthesia) used during the test).    Do not sign any important legal documents or operate any machinery for 24 hours (because of the anesthesia used during the test).  NUTRITION  Drink plenty of fluids.   You may resume your normal diet as instructed by your doctor.   Begin with a light meal and progress to your normal diet. Heavy or fried foods are harder to digest and may make you feel sick to your stomach (nauseated).   Avoid alcoholic beverages for 24 hours or as instructed.  MEDICATIONS  You may resume your normal medications unless your doctor tells you otherwise.  WHAT YOU CAN EXPECT TODAY  Some feelings of bloating in the abdomen.   Passage of more gas than usual.   Spotting of blood in your stool or on the toilet paper.  IF YOU HAD POLYPS REMOVED DURING THE COLONOSCOPY:  No aspirin products for 7 days or as instructed.   No alcohol for 7 days or as instructed.   Eat a soft diet for the next 24 hours.  FINDING OUT THE RESULTS OF YOUR TEST Not all test results are available during your visit. If your test results are not back during the visit, make an appointment with your caregiver to find out the results. Do not assume everything is normal if  you have not heard from your caregiver or the medical facility. It is important for you to follow up on all of your test results.  SEEK IMMEDIATE MEDICAL ATTENTION IF:  You have more than a spotting of blood in your stool.   Your belly is swollen (abdominal distention).   You are nauseated or vomiting.   You have a temperature over 101.   You have abdominal pain or discomfort that is severe or gets worse throughout the day.   Your colonoscopy revealed 4 polyp(s) which I removed successfully. Await pathology results, my office will contact you. I recommend repeating colonoscopy in 3 years for surveillance purposes.    I hope you have a great rest of your week!  Elon Alas. Abbey Chatters, D.O. Gastroenterology and Hepatology [facility]   Colon Polyps  Polyps are tissue growths inside the body. Polyps can grow in many places, including the large intestine (colon). A polyp may be a round bump or a mushroom-shaped growth. You could have one polyp or several. Most colon polyps are noncancerous (benign). However, some colon polyps can become cancerous over time. Finding and removing the polyps early can help prevent this. What are the causes? The exact cause of colon polyps is not known. What increases the risk? You are more likely to develop this condition if you:  Have a family history of colon cancer or colon polyps.  Are older than 50 or older than  11 if you are African American.  Have inflammatory bowel disease, such as ulcerative colitis or Crohn's disease.  Have certain hereditary conditions, such as: ? Familial adenomatous polyposis. ? Lynch syndrome. ? Turcot syndrome. ? Peutz-Jeghers syndrome.  Are overweight.  Smoke cigarettes.  Do not get enough exercise.  Drink too much alcohol.  Eat a diet that is high in fat and red meat and low in fiber.  Had childhood cancer that was treated with abdominal radiation. What are the signs or  symptoms? Most polyps do not cause symptoms. If you have symptoms, they may include:  Blood coming from your rectum when having a bowel movement.  Blood in your stool. The stool may look dark red or black.  Abdominal pain.  A change in bowel habits, such as constipation or diarrhea. How is this diagnosed? This condition is diagnosed with a colonoscopy. This is a procedure in which a lighted, flexible scope is inserted into the anus and then passed into the colon to examine the area. Polyps are sometimes found when a colonoscopy is done as part of routine cancer screening tests. How is this treated? Treatment for this condition involves removing any polyps that are found. Most polyps can be removed during a colonoscopy. Those polyps will then be tested for cancer. Additional treatment may be needed depending on the results of testing. Follow these instructions at home: Lifestyle  Maintain a healthy weight, or lose weight if recommended by your health care provider.  Exercise every day or as told by your health care provider.  Do not use any products that contain nicotine or tobacco, such as cigarettes and e-cigarettes. If you need help quitting, ask your health care provider.  If you drink alcohol, limit how much you have: ? 0-1 drink a day for women. ? 0-2 drinks a day for men.  Be aware of how much alcohol is in your drink. In the [place] one drink equals one 12 oz bottle of beer (355 mL), one 5 oz glass of wine (148 mL), or one 1 oz shot of hard liquor (44 mL). Eating and drinking   Eat foods that are high in fiber, such as fruits, vegetables, and whole grains.  Eat foods that are high in calcium and vitamin D, such as milk, cheese, yogurt, eggs, liver, fish, and broccoli.  Limit foods that are high in fat, such as fried foods and desserts.  Limit the amount of red meat and processed meat you eat, such as hot dogs, sausage, bacon, and lunch meats. General instructions  Keep  all follow-up visits as told by your health care provider. This is important. ? This includes having regularly scheduled colonoscopies. ? Talk to your health care provider about when you need a colonoscopy. Contact a health care provider if:  You have new or worsening bleeding during a bowel movement.  You have new or increased blood in your stool.  You have a change in bowel habits.  You lose weight for no known reason. Summary  Polyps are tissue growths inside the body. Polyps can grow in many places, including the colon.  Most colon polyps are noncancerous (benign), but some can become cancerous over time.  This condition is diagnosed with a colonoscopy.  Treatment for this condition involves removing any polyps that are found. Most polyps can be removed during a colonoscopy. This information is not intended to replace advice given to you by your health care provider. Make sure you discuss any questions you have with  your health care provider. Document Revised: 03/26/2018 Document Reviewed: 03/26/2018 Elsevier Patient Education  [place].

## 2020-11-13 LAB — SURGICAL PATHOLOGY

## 2020-11-15 ENCOUNTER — Encounter (HOSPITAL_COMMUNITY): Payer: Self-pay | Admitting: Internal Medicine

## 2020-11-23 ENCOUNTER — Encounter (HOSPITAL_COMMUNITY): Payer: Self-pay

## 2020-11-23 ENCOUNTER — Other Ambulatory Visit: Payer: Self-pay

## 2020-11-23 ENCOUNTER — Ambulatory Visit (HOSPITAL_COMMUNITY)
Admission: RE | Admit: 2020-11-23 | Discharge: 2020-11-23 | Disposition: A | Discharge status: Home / Self Care | Payer: Commercial Managed Care - PPO | Source: Ambulatory Visit | Attending: Internal Medicine | Admitting: Internal Medicine

## 2020-11-23 DIAGNOSIS — Z1231 Encounter for screening mammogram for malignant neoplasm of breast: Secondary | ICD-10-CM | POA: Insufficient documentation

## 2020-12-05 ENCOUNTER — Other Ambulatory Visit (HOSPITAL_COMMUNITY): Payer: Self-pay | Admitting: Internal Medicine

## 2020-12-05 DIAGNOSIS — R928 Other abnormal and inconclusive findings on diagnostic imaging of breast: Secondary | ICD-10-CM

## 2020-12-19 ENCOUNTER — Other Ambulatory Visit: Payer: Self-pay

## 2020-12-19 ENCOUNTER — Ambulatory Visit (HOSPITAL_COMMUNITY)
Admission: RE | Admit: 2020-12-19 | Discharge: 2020-12-19 | Disposition: A | Discharge status: Home / Self Care | Payer: Commercial Managed Care - PPO | Source: Ambulatory Visit | Attending: Internal Medicine | Admitting: Internal Medicine

## 2020-12-19 DIAGNOSIS — R928 Other abnormal and inconclusive findings on diagnostic imaging of breast: Secondary | ICD-10-CM

## 2021-01-09 ENCOUNTER — Encounter (HOSPITAL_COMMUNITY): Payer: Commercial Managed Care - PPO

## 2021-01-09 ENCOUNTER — Other Ambulatory Visit (HOSPITAL_COMMUNITY): Payer: Commercial Managed Care - PPO

## 2021-06-11 DIAGNOSIS — F411 Generalized anxiety disorder: Secondary | ICD-10-CM | POA: Insufficient documentation

## 2021-06-18 ENCOUNTER — Other Ambulatory Visit: Payer: Self-pay

## 2021-06-18 ENCOUNTER — Ambulatory Visit (INDEPENDENT_AMBULATORY_CARE_PROVIDER_SITE_OTHER): Payer: Commercial Managed Care - PPO | Admitting: Adult Health

## 2021-06-18 ENCOUNTER — Encounter: Payer: Self-pay | Admitting: Adult Health

## 2021-06-18 VITALS — BP 150/90 | HR 92 | Ht 64.0 in | Wt 168.0 lb

## 2021-06-18 DIAGNOSIS — N950 Postmenopausal bleeding: Secondary | ICD-10-CM | POA: Diagnosis not present

## 2021-06-18 DIAGNOSIS — R87810 Cervical high risk human papillomavirus (HPV) DNA test positive: Secondary | ICD-10-CM | POA: Diagnosis not present

## 2021-06-18 HISTORY — DX: Cervical high risk human papillomavirus (HPV) DNA test positive: R87.810

## 2021-06-18 NOTE — Progress Notes (Addendum)
Patient ID: Felicia Daniel, female   DOB: 06-Nov-1961, 60 y.o.   MRN: [id number] History of Present Illness: Felicia Daniel is a 60 year old white female, married, PM, referred by Dr Nevada Crane for 1 day of vaginal thin pink bleeding in May, none since. She had pap 05/29/21 at Dr Juel Burrow office and cervix bleed some then. Has felt something like a egg in vagina that comes and goes. PCP is Dr Nevada Crane.  Current Medications, Allergies, Past Medical History, Past Surgical History, Family History and Social History were reviewed in [employer] record.     Review of Systems: 1 day vaginal bleeding  No pain Not sexually active Denies any problems with urination or bowel movements     Physical Exam:BP (!) 150/90 (BP Location: Right Arm, Patient Position: Sitting, Cuff Size: Normal)   Pulse 92   Ht 5\' 4"  (1.626 m)   Wt 168 lb (76.2 kg)   LMP 10/21/2016   BMI 28.84 kg/m   General:  Well developed, well nourished, no acute distress Skin:  Warm and dry Lungs; Clear to auscultation bilaterally Cardiovascular: Regular rate and rhythm Pelvic:  External genitalia is normal in appearance, no lesions.  The vagina is normal in appearance. Urethra has no lesions or masses. The cervix is smooth.  Uterus is felt to be normal size, shape, and contour.  No adnexal masses or tenderness noted.Bladder is non tender, no masses felt. Extremities/musculoskeletal:  No swelling or varicosities noted, no clubbing or cyanosis Psych:  No mood changes, alert and cooperative,seems happy AA is 0 Fall risk is low Depression screen PHQ 2/9 06/18/2021  Decreased Interest 2  Down, Depressed, Hopeless 3  PHQ - 2 Score 5  Altered sleeping 1  Tired, decreased energy 1  Change in appetite 1  Feeling bad or failure about yourself  0  Trouble concentrating 0  Moving slowly or fidgety/restless 0  Suicidal thoughts 0  PHQ-9 Score 8   She says husband and son killed in [place] on 07/07/23, years ago and death anniversary coming  up. GAD 7 : Generalized Anxiety Score 06/18/2021  Nervous, Anxious, on Edge 1  Control/stop worrying 0  Worry too much - different things 1  Trouble relaxing 1  Restless 0  Easily annoyed or irritable 1  Afraid - awful might happen 0  Total GAD 7 Score 4      Upstream - 06/18/21 0856       Pregnancy Intention Screening   Does the patient want to become pregnant in the next year? No    Does the patient's partner want to become pregnant in the next year? No    Would the patient like to discuss contraceptive options today? No      Contraception Wrap Up   Current Method No Method - Other Reason   Postmenopausal   End Method No Method - Other Reason   postmenopausal   Contraception Counseling Provided No            Examination chaperoned by Diona Fanti CMA.   Impression and Plan: 1. PMB (postmenopausal bleeding) Will get GYN [place] at [facility]  Scheduled for 06/22/21 at 2:30 pm    When pap results sent pap negative for malignancy and +HPV other, repeat pap in 1 year per ASCCP guidelines, as pt said all other paps negative.

## 2021-06-22 ENCOUNTER — Other Ambulatory Visit: Payer: Self-pay

## 2021-06-22 ENCOUNTER — Ambulatory Visit (HOSPITAL_COMMUNITY)
Admission: RE | Admit: 2021-06-22 | Discharge: 2021-06-22 | Disposition: A | Discharge status: Home / Self Care | Payer: Commercial Managed Care - PPO | Source: Ambulatory Visit | Attending: Adult Health | Admitting: Adult Health

## 2021-06-22 DIAGNOSIS — N950 Postmenopausal bleeding: Secondary | ICD-10-CM | POA: Diagnosis present

## 2021-06-26 ENCOUNTER — Telehealth: Payer: Self-pay | Admitting: Adult Health

## 2021-06-26 DIAGNOSIS — N950 Postmenopausal bleeding: Secondary | ICD-10-CM

## 2021-06-26 NOTE — Telephone Encounter (Signed)
Pt aware that US showed thickened endometrium will need endo biopsy, she will call back for appt with Dr Elonda Husky or Dr Nelda Marseille

## 2021-07-02 ENCOUNTER — Encounter: Payer: Self-pay | Admitting: Obstetrics & Gynecology

## 2021-07-02 ENCOUNTER — Other Ambulatory Visit: Payer: Self-pay

## 2021-07-02 ENCOUNTER — Ambulatory Visit (INDEPENDENT_AMBULATORY_CARE_PROVIDER_SITE_OTHER): Payer: Commercial Managed Care - PPO | Admitting: Obstetrics & Gynecology

## 2021-07-02 ENCOUNTER — Other Ambulatory Visit (HOSPITAL_COMMUNITY)
Admission: RE | Admit: 2021-07-02 | Discharge: 2021-07-02 | Disposition: A | Discharge status: Home / Self Care | Payer: Commercial Managed Care - PPO | Source: Ambulatory Visit | Attending: Obstetrics & Gynecology | Admitting: Obstetrics & Gynecology

## 2021-07-02 VITALS — BP 148/91 | HR 67 | Ht 64.0 in | Wt 171.0 lb

## 2021-07-02 DIAGNOSIS — N950 Postmenopausal bleeding: Secondary | ICD-10-CM | POA: Diagnosis not present

## 2021-07-02 DIAGNOSIS — R9389 Abnormal findings on diagnostic imaging of other specified body structures: Secondary | ICD-10-CM | POA: Diagnosis present

## 2021-07-02 NOTE — Addendum Note (Signed)
Addended by: Linton Rump on: 07/02/2021 04:45 PM   Modules accepted: Orders

## 2021-07-02 NOTE — Progress Notes (Signed)
Endometrial Biopsy Procedure Note  Pre-operative Diagnosis: Post menopausal bleeding with thickened endometrium on sonogram, 8.3 mm  Post-operative Diagnosis: same  Indications: postmenopausal bleeding  Procedure Details   Urine pregnancy test was not done.  The risks (including infection, bleeding, pain, and uterine perforation) and benefits of the procedure were explained to the patient and Written informed consent was obtained.  Antibiotic prophylaxis against endocarditis was not indicated.   The patient was placed in the dorsal lithotomy position.  Bimanual exam showed the uterus to be in the neutral position.  A Graves' speculum inserted in the vagina, and the cervix prepped with povidone iodine.  Endocervical curettage with a Kevorkian curette was not performed.   A sharp tenaculum was applied to the anterior lip of the cervix for stabilization.  A sterile uterine sound was used to sound the uterus to a depth of 8 cm.  A Pipelle endometrial aspirator was used to sample the endometrium.  Sample was sent for pathologic examination.  Condition: Stable  Complications: None  Plan:  The patient was advised to call for any fever or for prolonged or severe pain or bleeding. She was advised to use OTC analgesics as needed for mild to moderate pain. She was advised to avoid vaginal intercourse for 48 hours or until the bleeding has completely stopped.  Attending Physician Documentation: I was present for or performed the following: endometrial biopsy

## 2021-07-04 LAB — SURGICAL PATHOLOGY

## 2021-07-07 DIAGNOSIS — E782 Mixed hyperlipidemia: Secondary | ICD-10-CM | POA: Insufficient documentation

## 2021-07-12 DIAGNOSIS — B009 Herpesviral infection, unspecified: Secondary | ICD-10-CM | POA: Insufficient documentation

## 2021-07-12 DIAGNOSIS — E663 Overweight: Secondary | ICD-10-CM | POA: Insufficient documentation

## 2021-07-12 DIAGNOSIS — L309 Dermatitis, unspecified: Secondary | ICD-10-CM | POA: Insufficient documentation

## 2021-08-13 DIAGNOSIS — R300 Dysuria: Secondary | ICD-10-CM | POA: Insufficient documentation

## 2021-10-03 ENCOUNTER — Other Ambulatory Visit: Payer: Self-pay

## 2021-10-03 ENCOUNTER — Other Ambulatory Visit (HOSPITAL_COMMUNITY): Payer: Self-pay | Admitting: Family Medicine

## 2021-10-03 ENCOUNTER — Ambulatory Visit (HOSPITAL_COMMUNITY)
Admission: RE | Admit: 2021-10-03 | Discharge: 2021-10-03 | Disposition: A | Discharge status: Home / Self Care | Payer: Commercial Managed Care - PPO | Source: Ambulatory Visit | Attending: Family Medicine | Admitting: Family Medicine

## 2021-10-03 DIAGNOSIS — M25572 Pain in left ankle and joints of left foot: Secondary | ICD-10-CM | POA: Diagnosis not present

## 2021-10-18 ENCOUNTER — Other Ambulatory Visit: Payer: Self-pay

## 2021-10-18 ENCOUNTER — Encounter: Payer: Self-pay | Admitting: Orthopedic Surgery

## 2021-10-18 ENCOUNTER — Ambulatory Visit (INDEPENDENT_AMBULATORY_CARE_PROVIDER_SITE_OTHER): Payer: 59 | Admitting: Orthopedic Surgery

## 2021-10-18 VITALS — BP 162/103 | HR 68 | Ht 63.0 in | Wt 169.0 lb

## 2021-10-18 DIAGNOSIS — M76822 Posterior tibial tendinitis, left leg: Secondary | ICD-10-CM | POA: Diagnosis not present

## 2021-10-18 DIAGNOSIS — M76829 Posterior tibial tendinitis, unspecified leg: Secondary | ICD-10-CM | POA: Diagnosis not present

## 2021-10-18 MED ORDER — PREDNISONE 10 MG (48) PO TBPK: ORAL_TABLET | Freq: Every day | ORAL | 1 refills | Status: DC
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
## 2021-10-18 NOTE — Patient Instructions (Signed)
OOW 6 WEEKS

## 2021-11-08 ENCOUNTER — Other Ambulatory Visit (HOSPITAL_COMMUNITY): Payer: Self-pay | Admitting: Internal Medicine

## 2021-11-08 DIAGNOSIS — Z1231 Encounter for screening mammogram for malignant neoplasm of breast: Secondary | ICD-10-CM

## 2021-11-29 ENCOUNTER — Ambulatory Visit (INDEPENDENT_AMBULATORY_CARE_PROVIDER_SITE_OTHER): Payer: 59 | Admitting: Orthopedic Surgery

## 2021-11-29 ENCOUNTER — Encounter: Payer: Self-pay | Admitting: Orthopedic Surgery

## 2021-11-29 ENCOUNTER — Other Ambulatory Visit: Payer: Self-pay

## 2021-11-29 DIAGNOSIS — M76829 Posterior tibial tendinitis, unspecified leg: Secondary | ICD-10-CM | POA: Diagnosis not present
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
## 2021-11-30 ENCOUNTER — Telehealth: Payer: Self-pay | Admitting: Orthopedic Surgery

## 2021-11-30 NOTE — Telephone Encounter (Signed)
Called patient to follow up on short-term disability for which she filled out and brought in fee (money order) for [employer], our forms provider - we have it holding here in [employer] box. States she is not sure if she actually started the claim, as previously discussed; will call Benefits or will call her contact person at [facility]; she will let us know.

## 2021-12-20 ENCOUNTER — Ambulatory Visit (HOSPITAL_COMMUNITY)
Admission: RE | Admit: 2021-12-20 | Discharge: 2021-12-20 | Disposition: A | Discharge status: Home / Self Care | Payer: 59 | Source: Ambulatory Visit | Attending: Internal Medicine | Admitting: Internal Medicine

## 2021-12-20 ENCOUNTER — Other Ambulatory Visit: Payer: Self-pay

## 2021-12-20 DIAGNOSIS — Z1231 Encounter for screening mammogram for malignant neoplasm of breast: Secondary | ICD-10-CM | POA: Insufficient documentation

## 2021-12-21 ENCOUNTER — Ambulatory Visit (HOSPITAL_COMMUNITY): Payer: 59

## 2021-12-27 ENCOUNTER — Ambulatory Visit (INDEPENDENT_AMBULATORY_CARE_PROVIDER_SITE_OTHER): Payer: 59 | Admitting: Orthopedic Surgery

## 2021-12-27 ENCOUNTER — Encounter: Payer: Self-pay | Admitting: Orthopedic Surgery

## 2021-12-27 ENCOUNTER — Other Ambulatory Visit: Payer: Self-pay

## 2021-12-27 ENCOUNTER — Telehealth: Payer: Self-pay | Admitting: Radiology

## 2021-12-27 DIAGNOSIS — M76822 Posterior tibial tendinitis, left leg: Secondary | ICD-10-CM | POA: Diagnosis not present

## 2021-12-27 DIAGNOSIS — M76829 Posterior tibial tendinitis, unspecified leg: Secondary | ICD-10-CM

## 2021-12-27 DIAGNOSIS — M25572 Pain in left ankle and joints of left foot: Secondary | ICD-10-CM
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
## 2021-12-27 NOTE — Patient Instructions (Signed)
While we are working on your approval for MRI please go ahead and call to schedule your appointment with [facility] within at least one (1) week.   Central Scheduling [phone]  

## 2021-12-27 NOTE — Telephone Encounter (Signed)
Reminder to put in referral to Dr Sharol Given after the MRI / Dr Aline Brochure will call patient after the MRI

## 2022-01-07 ENCOUNTER — Ambulatory Visit (HOSPITAL_COMMUNITY)
Admission: RE | Admit: 2022-01-07 | Discharge: 2022-01-07 | Disposition: A | Discharge status: Home / Self Care | Payer: 59 | Source: Ambulatory Visit | Attending: Orthopedic Surgery | Admitting: Orthopedic Surgery

## 2022-01-07 ENCOUNTER — Other Ambulatory Visit: Payer: Self-pay

## 2022-01-07 DIAGNOSIS — M25572 Pain in left ankle and joints of left foot: Secondary | ICD-10-CM | POA: Diagnosis present

## 2022-01-07 DIAGNOSIS — M76829 Posterior tibial tendinitis, unspecified leg: Secondary | ICD-10-CM | POA: Insufficient documentation

## 2022-01-07 DIAGNOSIS — M19072 Primary osteoarthritis, left ankle and foot: Secondary | ICD-10-CM | POA: Diagnosis not present

## 2022-01-11 ENCOUNTER — Telehealth: Payer: Self-pay | Admitting: Orthopedic Surgery

## 2022-01-11 NOTE — Telephone Encounter (Signed)
I called Felicia Daniel this morning and discussed her MRI findings she has a partial tear of the peroneus longus tendon and then she has a anterior talofibular ligament tear and the midfoot arthritis  It looks like her posterior tibial tendon is intact  I told her we will reserve the opinion about what to do for Dr. Sharol Given but options are bracing which could require custom bracing versus tenodesis of the tendon plus or minus ligament repair plus or minus midfoot fusion

## 2022-01-24 ENCOUNTER — Encounter: Payer: Self-pay | Admitting: Orthopedic Surgery

## 2022-01-28 ENCOUNTER — Encounter: Payer: Self-pay | Admitting: Orthopedic Surgery

## 2022-01-28 ENCOUNTER — Telehealth: Payer: Self-pay | Admitting: Orthopedic Surgery

## 2022-01-28 ENCOUNTER — Other Ambulatory Visit: Payer: Self-pay

## 2022-01-28 ENCOUNTER — Ambulatory Visit (INDEPENDENT_AMBULATORY_CARE_PROVIDER_SITE_OTHER): Payer: 59 | Admitting: Orthopedic Surgery

## 2022-01-28 DIAGNOSIS — M76829 Posterior tibial tendinitis, unspecified leg: Secondary | ICD-10-CM
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
## 2022-01-28 NOTE — Telephone Encounter (Signed)
Patient called. She would like to go ahead and have surgery on her foot. Her call back number is [phone]

## 2022-01-28 NOTE — Telephone Encounter (Signed)
Pt informed

## 2022-01-28 NOTE — Telephone Encounter (Signed)
Patient was seen today for left foot

## 2022-02-13 ENCOUNTER — Telehealth: Payer: Self-pay | Admitting: Orthopedic Surgery

## 2022-02-13 NOTE — Telephone Encounter (Signed)
Pt came in to file paperwork but is unable to until she gets a surg date set up for Dr. Sharol Given. Pt worried as she has not heard anything since her previous appt on 01/28/22. The best call back number is [phone].

## 2022-02-19 NOTE — Telephone Encounter (Signed)
I called and left a message for Felicia Daniel to please return my call to discuss possible dates for surgery.

## 2022-02-27 ENCOUNTER — Other Ambulatory Visit: Payer: Self-pay | Admitting: Family

## 2022-02-28 ENCOUNTER — Encounter (HOSPITAL_COMMUNITY): Payer: Self-pay | Admitting: Orthopedic Surgery

## 2022-02-28 ENCOUNTER — Other Ambulatory Visit: Payer: Self-pay

## 2022-02-28 NOTE — Progress Notes (Signed)
Felicia Daniel denies chest pain or shortness of breath. Patient denies having any s/s of Covid in her household.  Patient denies any known exposure to Covid.  ? ?PCP is Dr. Casimer Leek. ? ?I instructed Felicia Daniel  to shower with antibacteria soap.  \DO not shave. No nail polish, artificial or acrylic nails. Wear clean clothes, brush your teeth. ?Glasses, contact lens,dentures or partials may not be worn in the OR. If you need to wear them, please bring a case for glasses, do not wear contacts or bring a case, the hospital does not have contact cases, dentures or partials will have to be removed , make sure they are clean, we will provide a denture cup to put them in. You will need some one to drive you home and a responsible person over the age of 58 to stay with you for the first 24 hours after surgery.  ?

## 2022-02-28 NOTE — Anesthesia Preprocedure Evaluation (Addendum)
Anesthesia Evaluation  ?Patient identified by MRN, date of birth, ID band ?Patient awake ? ? ? ?Reviewed: ?Allergy & Precautions, NPO status , Patient's Chart, lab work & pertinent test results ? ?Airway ?Mallampati: I ? ? ? ? ? ? Dental ? ?(+) Missing, Poor Dentition ?  ?Pulmonary ?neg pulmonary ROS,  ?  ?Pulmonary exam normal ? ? ? ? ? ? ? Cardiovascular ?negative cardio ROS ?Normal cardiovascular exam ? ? ?  ?Neuro/Psych ?PSYCHIATRIC DISORDERS Anxiety Depression negative neurological ROS ?   ? GI/Hepatic ?negative GI ROS, Neg liver ROS,   ?Endo/Other  ?negative endocrine ROS ? Renal/GU ?negative Renal ROS  ? ?  ?Musculoskeletal ? ? Abdominal ?Normal abdominal exam  (+)   ?Peds ? Hematology ?  ?Anesthesia Other Findings ? ? Reproductive/Obstetrics ? ?  ? ? ? ? ? ? ? ? ? ? ? ? ? ?  ?  ? ? ? ? ? ? ? ?Anesthesia Physical ?Anesthesia Plan ? ?ASA: 2 ? ?Anesthesia Plan: General  ? ?Post-op Pain Management: Regional block*  ? ?Induction:  ? ?PONV Risk Score and Plan: 3 and Ondansetron, Dexamethasone and Midazolam ? ?Airway Management Planned: LMA ? ?Additional Equipment: None ? ?Intra-op Plan:  ? ?Post-operative Plan: Extubation in OR ? ?Informed Consent: I have reviewed the patients History and Physical, chart, labs and discussed the procedure including the risks, benefits and alternatives for the proposed anesthesia with the patient or authorized representative who has indicated his/her understanding and acceptance.  ? ? ? ?Dental advisory given ? ?Plan Discussed with: CRNA ? ?Anesthesia Plan Comments:   ? ? ? ? ? ?Anesthesia Quick Evaluation ? ?

## 2022-03-01 ENCOUNTER — Ambulatory Visit (HOSPITAL_COMMUNITY)
Admission: RE | Admit: 2022-03-01 | Discharge: 2022-03-01 | Disposition: A | Discharge status: Home / Self Care | Payer: 59 | Attending: Orthopedic Surgery | Admitting: Orthopedic Surgery

## 2022-03-01 ENCOUNTER — Other Ambulatory Visit: Payer: Self-pay

## 2022-03-01 ENCOUNTER — Encounter (HOSPITAL_COMMUNITY): Payer: Self-pay | Admitting: Orthopedic Surgery

## 2022-03-01 ENCOUNTER — Encounter (HOSPITAL_COMMUNITY)
Admission: RE | Disposition: A | Discharge status: Home / Self Care | Payer: Self-pay | Source: Home / Self Care | Attending: Orthopedic Surgery

## 2022-03-01 ENCOUNTER — Ambulatory Visit (HOSPITAL_COMMUNITY): Payer: 59 | Admitting: Anesthesiology

## 2022-03-01 ENCOUNTER — Ambulatory Visit (HOSPITAL_COMMUNITY): Payer: 59

## 2022-03-01 ENCOUNTER — Ambulatory Visit (HOSPITAL_BASED_OUTPATIENT_CLINIC_OR_DEPARTMENT_OTHER): Payer: 59 | Admitting: Anesthesiology

## 2022-03-01 DIAGNOSIS — M76822 Posterior tibial tendinitis, left leg: Secondary | ICD-10-CM | POA: Diagnosis not present

## 2022-03-01 DIAGNOSIS — F419 Anxiety disorder, unspecified: Secondary | ICD-10-CM | POA: Insufficient documentation

## 2022-03-01 DIAGNOSIS — Z9889 Other specified postprocedural states: Secondary | ICD-10-CM | POA: Insufficient documentation

## 2022-03-01 DIAGNOSIS — G8918 Other acute postprocedural pain: Secondary | ICD-10-CM | POA: Diagnosis not present

## 2022-03-01 DIAGNOSIS — M67874 Other specified disorders of tendon, left ankle and foot: Secondary | ICD-10-CM | POA: Insufficient documentation

## 2022-03-01 HISTORY — DX: Anemia, unspecified: D64.9

## 2022-03-01 HISTORY — DX: Unspecified osteoarthritis, unspecified site: M19.90

## 2022-03-01 HISTORY — PX: FOOT ARTHRODESIS: SHX1655

## 2022-03-01 LAB — CBC
HCT: 40.9 % (ref 36.0–46.0)
Hemoglobin: 13.9 g/dL (ref 12.0–15.0)
MCH: 29.3 pg (ref 26.0–34.0)
MCHC: 34 g/dL (ref 30.0–36.0)
MCV: 86.3 fL (ref 80.0–100.0)
Platelets: 190 10*3/uL (ref 150–400)
RBC: 4.74 MIL/uL (ref 3.87–5.11)
RDW: 12.5 % (ref 11.5–15.5)
WBC: 6.1 10*3/uL (ref 4.0–10.5)
nRBC: 0 % (ref 0.0–0.2)

## 2022-03-01 SURGERY — FUSION, JOINT, FOOT
Anesthesia: General | Site: Foot | Laterality: Left

## 2022-03-01 MED ORDER — ACETAMINOPHEN 325 MG PO TABS: 325.0000 mg | ORAL_TABLET | ORAL | Status: DC | PRN

## 2022-03-01 MED ORDER — PROPOFOL 10 MG/ML IV BOLUS
INTRAVENOUS | Status: AC
  Filled 2022-03-01: qty 20

## 2022-03-01 MED ORDER — CHLORHEXIDINE GLUCONATE 0.12 % MT SOLN
15.0000 mL | OROMUCOSAL | Status: AC
Start: 2022-03-01 — End: 2022-03-01
  Administered 2022-03-01: 15 mL via OROMUCOSAL
  Filled 2022-03-01 (×2): qty 15

## 2022-03-01 MED ORDER — CEFAZOLIN SODIUM-DEXTROSE 2-4 GM/100ML-% IV SOLN
2.0000 g | INTRAVENOUS | Status: AC
  Administered 2022-03-01: 2 g via INTRAVENOUS
  Filled 2022-03-01: qty 100

## 2022-03-01 MED ORDER — FENTANYL CITRATE (PF) 250 MCG/5ML IJ SOLN
INTRAMUSCULAR | Status: DC | PRN
Start: 2022-03-01 — End: 2022-03-01
  Administered 2022-03-01: 100 ug via INTRAVENOUS
  Administered 2022-03-01 (×3): 50 ug via INTRAVENOUS

## 2022-03-01 MED ORDER — DEXAMETHASONE SODIUM PHOSPHATE 10 MG/ML IJ SOLN
INTRAMUSCULAR | Status: DC | PRN
  Administered 2022-03-01: 10 mg via INTRAVENOUS

## 2022-03-01 MED ORDER — KETOROLAC TROMETHAMINE 15 MG/ML IJ SOLN
15.0000 mg | Freq: Once | INTRAMUSCULAR | Status: AC
Start: 2022-03-01 — End: 2022-03-01
  Administered 2022-03-01: 15 mg via INTRAVENOUS

## 2022-03-01 MED ORDER — PROPOFOL 10 MG/ML IV BOLUS
INTRAVENOUS | Status: DC | PRN
  Administered 2022-03-01: 200 mg via INTRAVENOUS

## 2022-03-01 MED ORDER — FENTANYL CITRATE (PF) 250 MCG/5ML IJ SOLN
INTRAMUSCULAR | Status: AC
  Filled 2022-03-01: qty 5

## 2022-03-01 MED ORDER — SUCCINYLCHOLINE CHLORIDE 200 MG/10ML IV SOSY
PREFILLED_SYRINGE | INTRAVENOUS | Status: DC | PRN
  Administered 2022-03-01: 120 mg via INTRAVENOUS

## 2022-03-01 MED ORDER — ROPIVACAINE HCL 5 MG/ML IJ SOLN
INTRAMUSCULAR | Status: DC | PRN
  Administered 2022-03-01 (×6): 5 mL via PERINEURAL

## 2022-03-01 MED ORDER — 0.9 % SODIUM CHLORIDE (POUR BTL) OPTIME
TOPICAL | Status: DC | PRN
  Administered 2022-03-01: 1000 mL

## 2022-03-01 MED ORDER — MIDAZOLAM HCL 2 MG/2ML IJ SOLN
INTRAMUSCULAR | Status: DC | PRN
  Administered 2022-03-01: 2 mg via INTRAVENOUS

## 2022-03-01 MED ORDER — PHENYLEPHRINE 40 MCG/ML (10ML) SYRINGE FOR IV PUSH (FOR BLOOD PRESSURE SUPPORT)
PREFILLED_SYRINGE | INTRAVENOUS | Status: DC | PRN
  Administered 2022-03-01: 120 ug via INTRAVENOUS

## 2022-03-01 MED ORDER — EPHEDRINE SULFATE-NACL 50-0.9 MG/10ML-% IV SOSY
PREFILLED_SYRINGE | INTRAVENOUS | Status: DC | PRN
  Administered 2022-03-01: 15 mg via INTRAVENOUS
  Administered 2022-03-01 (×3): 10 mg via INTRAVENOUS

## 2022-03-01 MED ORDER — LACTATED RINGERS IV SOLN: INTRAVENOUS | Status: DC

## 2022-03-01 MED ORDER — ONDANSETRON HCL 4 MG/2ML IJ SOLN
INTRAMUSCULAR | Status: DC | PRN
  Administered 2022-03-01: 4 mg via INTRAVENOUS

## 2022-03-01 MED ORDER — LIDOCAINE 2% (20 MG/ML) 5 ML SYRINGE
INTRAMUSCULAR | Status: DC | PRN
Start: 2022-03-01 — End: 2022-03-01
  Administered 2022-03-01: 100 mg via INTRAVENOUS

## 2022-03-01 MED ORDER — ONDANSETRON HCL 4 MG/2ML IJ SOLN: 4.0000 mg | Freq: Once | INTRAMUSCULAR | Status: DC | PRN

## 2022-03-01 MED ORDER — ROPIVACAINE HCL 7.5 MG/ML IJ SOLN
INTRAMUSCULAR | Status: DC | PRN
  Administered 2022-03-01 (×4): 5 mL via PERINEURAL

## 2022-03-01 MED ORDER — MIDAZOLAM HCL 2 MG/2ML IJ SOLN
INTRAMUSCULAR | Status: AC
  Filled 2022-03-01: qty 2

## 2022-03-01 MED ORDER — FENTANYL CITRATE (PF) 100 MCG/2ML IJ SOLN: 25.0000 ug | INTRAMUSCULAR | Status: DC | PRN

## 2022-03-01 MED ORDER — OXYCODONE-ACETAMINOPHEN 5-325 MG PO TABS: 1.0000 | ORAL_TABLET | ORAL | 0 refills | Status: DC | PRN

## 2022-03-01 MED ORDER — KETOROLAC TROMETHAMINE 15 MG/ML IJ SOLN
INTRAMUSCULAR | Status: AC
  Filled 2022-03-01: qty 1

## 2022-03-01 MED ORDER — OXYCODONE HCL 5 MG/5ML PO SOLN: 5.0000 mg | Freq: Once | ORAL | Status: DC | PRN

## 2022-03-01 MED ORDER — OXYCODONE HCL 5 MG PO TABS: 5.0000 mg | ORAL_TABLET | Freq: Once | ORAL | Status: DC | PRN

## 2022-03-01 MED ORDER — ACETAMINOPHEN 160 MG/5ML PO SOLN: 325.0000 mg | ORAL | Status: DC | PRN

## 2022-03-01 SURGICAL SUPPLY — 47 items
BAG COUNTER SPONGE SURGICOUNT (BAG) ×2 IMPLANT
BANDAGE ESMARK 6X9 LF (GAUZE/BANDAGES/DRESSINGS) IMPLANT
BIT DRILL 4.5 TYB 9 (BIT) ×1 IMPLANT
BLADE SAW SGTL HD 18.5X60.5X1. (BLADE) ×2 IMPLANT
BLADE SURG 10 STRL SS (BLADE) IMPLANT
BNDG COHESIVE 4X5 TAN ST LF (GAUZE/BANDAGES/DRESSINGS) ×1 IMPLANT
BNDG ESMARK 6X9 LF (GAUZE/BANDAGES/DRESSINGS)
BNDG GAUZE ELAST 4 BULKY (GAUZE/BANDAGES/DRESSINGS) ×2 IMPLANT
BUR EGG ELITE 4.0 (BURR) ×1 IMPLANT
COVER MAYO STAND STRL (DRAPES) IMPLANT
COVER SURGICAL LIGHT HANDLE (MISCELLANEOUS) ×4 IMPLANT
DRAPE INCISE IOBAN 66X45 STRL (DRAPES) ×2 IMPLANT
DRAPE OEC MINIVIEW 54X84 (DRAPES) IMPLANT
DRAPE U-SHAPE 47X51 STRL (DRAPES) ×2 IMPLANT
DRSG ADAPTIC 3X8 NADH LF (GAUZE/BANDAGES/DRESSINGS) ×1 IMPLANT
DRSG PAD ABDOMINAL 8X10 ST (GAUZE/BANDAGES/DRESSINGS) ×1 IMPLANT
DURAPREP 26ML APPLICATOR (WOUND CARE) ×2 IMPLANT
ELECT REM PT RETURN 9FT ADLT (ELECTROSURGICAL) ×2
ELECTRODE REM PT RTRN 9FT ADLT (ELECTROSURGICAL) ×1 IMPLANT
GAUZE SPONGE 4X4 12PLY STRL (GAUZE/BANDAGES/DRESSINGS) ×1 IMPLANT
GLOVE SURG ORTHO LTX SZ9 (GLOVE) ×2 IMPLANT
GLOVE SURG UNDER POLY LF SZ9 (GLOVE) ×2 IMPLANT
GOWN STRL REUS W/ TWL XL LVL3 (GOWN DISPOSABLE) ×3 IMPLANT
GOWN STRL REUS W/TWL XL LVL3 (GOWN DISPOSABLE) ×3
GUIDEWIRE TYB 2X9 (WIRE) ×2 IMPLANT
KIT BASIN OR (CUSTOM PROCEDURE TRAY) ×2 IMPLANT
KIT STAPLE ARCUS 16X13 STRL (Staple) ×1 IMPLANT
KIT STAPLE ARCUS 20X17 STRL (Staple) ×1 IMPLANT
KIT TURNOVER KIT B (KITS) ×2 IMPLANT
MANIFOLD NEPTUNE II (INSTRUMENTS) ×2 IMPLANT
NS IRRIG 1000ML POUR BTL (IV SOLUTION) ×2 IMPLANT
PACK ORTHO EXTREMITY (CUSTOM PROCEDURE TRAY) ×2 IMPLANT
PAD ABD 8X10 STRL (GAUZE/BANDAGES/DRESSINGS) ×1 IMPLANT
PAD ARMBOARD 7.5X6 YLW CONV (MISCELLANEOUS) ×4 IMPLANT
PAD CAST 4YDX4 CTTN HI CHSV (CAST SUPPLIES) IMPLANT
PADDING CAST COTTON 4X4 STRL (CAST SUPPLIES) ×1
PUTTY DBM STAGRAFT PLUS 5CC (Putty) ×1 IMPLANT
SCREW CANN HDLS SHRT 6.5X75 (Screw) ×1 IMPLANT
SCREW CANN HDLS SHT 6.5X70 (Screw) ×1 IMPLANT
SPONGE T-LAP 18X18 ~~LOC~~+RFID (SPONGE) ×2 IMPLANT
SUCTION FRAZIER HANDLE 10FR (MISCELLANEOUS) ×1
SUCTION TUBE FRAZIER 10FR DISP (MISCELLANEOUS) ×1 IMPLANT
SUT ETHILON 2 0 PSLX (SUTURE) ×5 IMPLANT
TOWEL GREEN STERILE (TOWEL DISPOSABLE) ×2 IMPLANT
TOWEL GREEN STERILE FF (TOWEL DISPOSABLE) ×2 IMPLANT
TUBE CONNECTING 12X1/4 (SUCTIONS) ×2 IMPLANT
WATER STERILE IRR 1000ML POUR (IV SOLUTION) ×2 IMPLANT

## 2022-03-01 NOTE — Op Note (Addendum)
03/01/2022 ? ?8:39 AM ? ?PATIENT:  Felicia Daniel   ? ?PRE-OPERATIVE DIAGNOSIS:  Left Posterior Tibial Tendon Insufficiency ? ?POST-OPERATIVE DIAGNOSIS:  Same ? ?PROCEDURE:  LEFT TALONAVICULAR AND SUBTALAR FUSION ?C arm fluoroscopy to verify reduction. ?Application of stay graft 5 cc. ? ?SURGEON:  Newt Minion, MD ? ?PHYSICIAN ASSISTANT:None ?ANESTHESIA:   General ? ?PREOPERATIVE INDICATIONS:  Felicia Daniel is a  61 y.o. female with a diagnosis of Left Posterior Tibial Tendon Insufficiency who failed conservative measures and elected for surgical management.   ? ?The risks benefits and alternatives were discussed with the patient preoperatively including but not limited to the risks of infection, bleeding, nerve injury, cardiopulmonary complications, the need for revision surgery, among others, and the patient was willing to proceed. ? ?OPERATIVE IMPLANTS: Stay graft 5 cc with 2 staples and 2 headless cannulated screws ? ?'@ENCIMAGES'$ @ ? ?OPERATIVE FINDINGS: C-arm possibly verified reduction of the subtalar and talonavicular fusions. ? ?OPERATIVE PROCEDURE: Patient was brought the operating room after undergoing a regional block.  After adequate levels anesthesia were obtained patient's lower extremity was prepped using DuraPrep draped into a sterile field a timeout was called.  An oblique incision was made over the sinus Tarsi this was carried down to the subtalar joint retractors were placed to protect the neurovascular bundles dorsally and the peroneal tendons posteriorly.  A 4 mm rim of the bur was used to debride the subtalar joint this was then finished with a curette.  This was irrigated and packed with 3 cc of stay graft.  Attention was then focused on the talonavicular joint.  A dorsal incision was made between the anterior tibial tendon and the EHL tendon.  This was carried down to the talonavicular joint.  The rem B bur was used to debride the talonavicular joint this was then further debrided with a  curette.  This was packed with 2 cc of stay graft and stabilized with 2 staples.  2 guidewires then placed from the calcaneus into the talus C-arm possibly verified reduction and 2 headless cannulated screws were placed across the subtalar joint.  C-arm fluoroscopy verified reduction the incisions were closed using 2-0 nylon sterile dressing was applied patient was extubated taken the PACU in stable condition. ? ? ?DISCHARGE PLANNING: ? ?Antibiotic duration: Preoperative antibiotics ? ?Weightbearing: Touchdown weightbearing on the left ? ?Pain medication: Prescription for Percocet ? ?Dressing care/ Wound VAC: Dry dressing ? ?Ambulatory devices: Crutches walker or kneeling scooter ? ?Discharge to: Home. ? ?Follow-up: In the office 1 week post operative. ? ? ? ? ? ? ?  ?

## 2022-03-01 NOTE — Anesthesia Procedure Notes (Addendum)
Anesthesia Regional Block: Adductor canal block  ? ?Pre-Anesthetic Checklist: , timeout performed,  Correct Patient, Correct Site, Correct Laterality,  Correct Procedure, Correct Position, site marked,  Risks and benefits discussed,  Surgical consent,  Pre-op evaluation,  At surgeon's request and post-op pain management ? ?Laterality: Lower and Left ? ?Prep: chloraprep     ?  ?Needles:  ?Injection technique: Single-shot ? ?Needle Type: Echogenic Stimulator Needle   ? ? ?Needle Length: 9cm  ?Needle Gauge: 20  ? ?Needle insertion depth: 3 cm ? ? ?Additional Needles: ? ? ?Procedures:,,,, ultrasound used (permanent image in chart),,    ?Narrative:  ?Start time: 03/01/2022 7:17 AM ?End time: 03/01/2022 7:23 AM ?Injection made incrementally with aspirations every 5 mL. ? ?Performed by: Personally  ?Anesthesiologist: Lyn Hollingshead, MD ? ? ? ? ?

## 2022-03-01 NOTE — Progress Notes (Signed)
Orthopedic Tech Progress Note ?Patient Details:  ?Felicia Daniel ?[birth year] ?[id number] ? ?PACU RN called requesting a CAM WALKER and CRUTCHES  ? ?Ortho Devices ?Type of Ortho Device: CAM walker, Crutches ?Ortho Device/Splint Location: LLE ?Ortho Device/Splint Interventions: Ordered, Application, Adjustment ?  ?Post Interventions ?Patient Tolerated: Well, Ambulated well ?Instructions Provided: Care of device ? ?Janit Pagan ?03/01/2022, 9:15 AM ? ?
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
## 2022-03-01 NOTE — Transfer of Care (Signed)
Immediate Anesthesia Transfer of Care Note ? ?Patient: Felicia Daniel ? ?Procedure(s) Performed: LEFT TALONAVICULAR AND SUBTALAR FUSION (Left: Foot) ? ?Patient Location: PACU ? ?Anesthesia Type:GA combined with regional for post-op pain ? ?Level of Consciousness: awake and patient cooperative ? ?Airway & Oxygen Therapy: Patient Spontanous Breathing ? ?Post-op Assessment: Report given to RN and Post -op Vital signs reviewed and stable ? ?Post vital signs: Reviewed and stable ? ?Last Vitals:  ?Vitals Value Taken Time  ?BP 136/71 03/01/22 0845  ?Temp    ?Pulse 109 03/01/22 0846  ?Resp 23 03/01/22 0846  ?SpO2 97 % 03/01/22 0846  ?Vitals shown include unvalidated device data. ? ?Last Pain:  ?Vitals:  ? 03/01/22 0607  ?TempSrc:   ?PainSc: 0-No pain  ?   ? ?Patients Stated Pain Goal: 0 (03/01/22 0165) ? ?Complications: No notable events documented. ?

## 2022-03-01 NOTE — Anesthesia Postprocedure Evaluation (Signed)
Anesthesia Post Note ? ?Patient: Felicia Daniel ? ?Procedure(s) Performed: LEFT TALONAVICULAR AND SUBTALAR FUSION (Left: Foot) ? ?  ? ?Patient location during evaluation: PACU ?Anesthesia Type: General ?Level of consciousness: awake ?Pain management: pain level controlled ?Vital Signs Assessment: post-procedure vital signs reviewed and stable ?Respiratory status: spontaneous breathing ?Cardiovascular status: stable ?Postop Assessment: no apparent nausea or vomiting ?Anesthetic complications: no ? ? ?No notable events documented. ? ?Last Vitals:  ?Vitals:  ? 03/01/22 0927 03/01/22 0930  ?BP: 121/61   ?Pulse: 75 79  ?Resp: 19 16  ?Temp:  (!) 36.1 ?C  ?SpO2: 94% 97%  ?  ?Last Pain:  ?Vitals:  ? 03/01/22 0927  ?TempSrc:   ?PainSc: 0-No pain  ? ? ?  ?  ?  ?  ?  ?  ? ?Huston Foley ? ? ? ? ?

## 2022-03-01 NOTE — Anesthesia Procedure Notes (Signed)
Procedure Name: Intubation ?Date/Time: 03/01/2022 7:50 AM ?Performed by: Lance Coon, CRNA ?Pre-anesthesia Checklist: Patient identified, Emergency Drugs available, Suction available, Patient being monitored and Timeout performed ?Patient Re-evaluated:Patient Re-evaluated prior to induction ?Oxygen Delivery Method: Circle system utilized ?Preoxygenation: Pre-oxygenation with 100% oxygen ?Induction Type: IV induction, Rapid sequence and Cricoid Pressure applied ?Laryngoscope Size: Sabra Heck and 3 ?Grade View: Grade I ?Tube type: Oral ?Tube size: 7.0 mm ?Number of attempts: 1 ?Airway Equipment and Method: Stylet ?Placement Confirmation: ETT inserted through vocal cords under direct vision, positive ETCO2 and breath sounds checked- equal and bilateral ?Secured at: 21 cm ?Tube secured with: Tape ?Dental Injury: Teeth and Oropharynx as per pre-operative assessment  ? ? ? ? ?

## 2022-03-01 NOTE — Anesthesia Procedure Notes (Addendum)
Anesthesia Regional Block: Popliteal block  ? ?Pre-Anesthetic Checklist: , timeout performed,  Correct Patient, Correct Site, Correct Laterality,  Correct Procedure, Correct Position, site marked,  Risks and benefits discussed,  Surgical consent,  Pre-op evaluation,  At surgeon's request and post-op pain management ? ?Laterality: Lower and Left ? ?Prep: chloraprep     ?  ?Needles:  ?Injection technique: Single-shot ? ?Needle Type: Echogenic Stimulator Needle   ? ? ?Needle Length: 9cm  ?Needle Gauge: 20  ? ?Needle insertion depth: 2 cm ? ? ?Additional Needles: ? ? ?Procedures:,,,, ultrasound used (permanent image in chart),,    ?Narrative:  ?Start time: 03/01/2022 7:30 AM ?End time: 03/01/2022 7:36 AM ?Injection made incrementally with aspirations every 5 mL. ? ?Performed by: Personally  ?Anesthesiologist: Lyn Hollingshead, MD ? ? ? ? ?

## 2022-03-04 ENCOUNTER — Encounter: Payer: Self-pay | Admitting: Orthopedic Surgery

## 2022-03-04 ENCOUNTER — Telehealth: Payer: Self-pay | Admitting: Orthopedic Surgery

## 2022-03-04 ENCOUNTER — Ambulatory Visit (INDEPENDENT_AMBULATORY_CARE_PROVIDER_SITE_OTHER): Payer: 59 | Admitting: Orthopedic Surgery

## 2022-03-04 DIAGNOSIS — M76829 Posterior tibial tendinitis, unspecified leg: Secondary | ICD-10-CM

## 2022-03-04 NOTE — Telephone Encounter (Signed)
Pt submitted 3 medical release forms, FMLA for patient, FMLA for husband which is caretaker, and long term disability forms. Pt paid one $25.00 money order and $50.00 cash payment to [employer]. Accepted 03/04/22 ?

## 2022-03-04 NOTE — Progress Notes (Signed)
? ?Office Visit Note ?  ?Patient: Felicia Daniel           ?Date of Birth: 01-Oct-1961           ?MRN: [id number] ?Visit Date: 03/04/2022 ?             ?Requested by: Celene Squibb, MD ?[address] ?[name] ?[place],  [place] [address] ?PCP: Celene Squibb, MD ? ?Chief Complaint  ?Patient presents with  ? Left Foot - Routine Post Op  ?  03/01/22 left talonavicular and subtalar fusion walk in today with c/o bleeding.   ? ? ? ? ?HPI: ?Patient is a 61 year old woman who presents 3 days status post left talonavicular and subtalar fusion.  Patient presents due to blood on her dressing. ? ?Assessment & Plan: ?Visit Diagnoses:  ?1. Posterior tibial tendon dysfunction   ? ? ?Plan: Start Dial soap cleansing dry dressing changes nonweightbearing on the left.  Follow-up in 1 week with three-view radiographs of the left foot. ? ?Follow-Up Instructions: Return in about 1 week (around 03/11/2022).  ? ?Ortho Exam ? ?Patient is alert, oriented, no adenopathy, well-dressed, normal affect, normal respiratory effort. ?Examination of the skin edges are well approximated there were no ischemic changes no cellulitis.  There is slight gaping of the incision secondary to swelling.  Discussed the importance of elevation and working the calf pump.  4 x 4 and Ace wrap was applied. ? ?Imaging: ?No results found. ?No images are attached to the encounter. ? ?Labs: ?Lab Results  ?Component Value Date  ? REPTSTATUS 07/27/2019 FINAL 07/25/2019  ? CULT (A) 07/25/2019  ?  <10,000 COLONIES/mL INSIGNIFICANT GROWTH ?Performed at [facility], [place] [address], [place], [place] [address] ?  ? ? ? ?Lab Results  ?Component Value Date  ? ALBUMIN 4.0 07/25/2019  ? ? ?No results found for: MG ?No results found for: VD25OH ? ?No results found for: PREALBUMIN ?CBC EXTENDED Latest Ref Rng & Units 03/01/2022 07/25/2019  ?WBC 4.0 - 10.5 K/uL 6.1 12.8(H)  ?RBC 3.87 - 5.11 MIL/uL 4.74 5.00  ?HGB 12.0 - 15.0 g/dL 13.9 13.9  ?HCT 36.0 - 46.0 % 40.9 43.0  ?PLT 150 - 400 K/uL 190  191  ?NEUTROABS 1.7 - 7.7 K/uL - 10.5(H)  ?LYMPHSABS 0.7 - 4.0 K/uL - 1.5  ? ? ? ?There is no height or weight on file to calculate BMI. ? ?Orders:  ?No orders of the defined types were placed in this encounter. ? ?No orders of the defined types were placed in this encounter. ? ? ? Procedures: ?No procedures performed ? ?Clinical Data: ?No additional findings. ? ?ROS: ? ?All other systems negative, except as noted in the HPI. ?Review of Systems ? ?Objective: ?Vital Signs: LMP 10/21/2016  ? ?Specialty Comments:  ?No specialty comments available. ? ?PMFS History: ?Patient Active Problem List  ? Diagnosis Date Noted  ? Posterior tibial tendon dysfunction (PTTD) of left lower extremity   ? Dysuria 08/13/2021  ? Eczema 07/12/2021  ? Herpes simplex 07/12/2021  ? Hypocalcemia 07/12/2021  ? Overweight 07/12/2021  ? Mixed hyperlipidemia 07/07/2021  ? PMB (postmenopausal bleeding) 06/18/2021  ? Papanicolaou smear of cervix with positive high risk human papilloma virus (HPV) test 06/18/2021  ? Generalized anxiety disorder 06/11/2021  ? ?Past Medical History:  ?Diagnosis Date  ? Anemia   ? iron deficency in young adult life.  ? Anxiety   ? Arthritis   ? Depression   ? Herpes   ?  Papanicolaou smear of cervix with positive high risk human papilloma virus (HPV) test 06/18/2021  ? Repeat pap in 1 year   ?  ?Family History  ?Problem Relation Age of Onset  ? Skin cancer Father   ? Breast cancer Mother   ? Peripheral vascular disease Mother   ?  ?Past Surgical History:  ?Procedure Laterality Date  ? CHOLECYSTECTOMY    ? COLONOSCOPY WITH PROPOFOL N/A 11/10/2020  ? Procedure: COLONOSCOPY WITH PROPOFOL;  Surgeon: Eloise Harman, DO;  [facility];  Service: Endoscopy;  Laterality: N/A;  7:30  ? LEG SURGERY    ? POLYPECTOMY  11/10/2020  ? Procedure: POLYPECTOMY;  Surgeon: Eloise Harman, DO;  [facility];  Service: Endoscopy;;  ? ?Social History  ? ?Occupational History  ? Not on file  ?Tobacco Use  ? Smoking  status: Never  ? Smokeless tobacco: Never  ?Vaping Use  ? Vaping Use: Never used  ?Substance and Sexual Activity  ? Alcohol use: No  ? Drug use: Never  ? Sexual activity: Not Currently  ?  Birth control/protection: Post-menopausal  ? ? ? ? ? ?

## 2022-03-06 ENCOUNTER — Encounter (HOSPITAL_COMMUNITY): Payer: Self-pay | Admitting: Orthopedic Surgery

## 2022-03-11 ENCOUNTER — Ambulatory Visit (INDEPENDENT_AMBULATORY_CARE_PROVIDER_SITE_OTHER): Payer: 59 | Admitting: Orthopedic Surgery

## 2022-03-11 ENCOUNTER — Ambulatory Visit (INDEPENDENT_AMBULATORY_CARE_PROVIDER_SITE_OTHER): Payer: 59

## 2022-03-11 DIAGNOSIS — M25572 Pain in left ankle and joints of left foot: Secondary | ICD-10-CM

## 2022-03-11 DIAGNOSIS — M76829 Posterior tibial tendinitis, unspecified leg: Secondary | ICD-10-CM | POA: Diagnosis not present

## 2022-03-14 ENCOUNTER — Other Ambulatory Visit: Payer: Self-pay

## 2022-03-15 ENCOUNTER — Telehealth: Payer: Self-pay | Admitting: Orthopedic Surgery

## 2022-03-15 NOTE — Telephone Encounter (Signed)
Pt needed another work note stating specific dates that she has been out and will be out from surgery. Letter written and given to pt at the front desk. ?

## 2022-03-15 NOTE — Telephone Encounter (Signed)
Pt called requesting a call back from Dr. Sharol Given assistant. Please call pt. Did not disclose reasoning for call back. Phone number is [phone]. ?

## 2022-03-17 ENCOUNTER — Encounter: Payer: Self-pay | Admitting: Orthopedic Surgery

## 2022-03-17 NOTE — Progress Notes (Signed)
? ?Office Visit Note ?  ?Patient: Felicia Daniel           ?Date of Birth: 09/16/1961           ?MRN: [id number] ?Visit Date: 03/11/2022 ?             ?Requested by: Celene Squibb, MD ?[address] ?[name] ?[place],  [place] [address] ?PCP: Celene Squibb, MD ? ?Chief Complaint  ?Patient presents with  ? Left Foot - Routine Post Op  ?  03/01/22 left talonavicular and subtalar fusion   ? ? ? ? ?HPI: ?Patient is a 61 year old woman who is seen 2 weeks status post left talonavicular and subtalar fusion for posterior tibial tendon insufficiency.  She is currently nonweightbearing in a fracture boot with crutches. ? ?Assessment & Plan: ?Visit Diagnoses:  ?1. Posterior tibial tendon dysfunction   ?2. Pain in left ankle and joints of left foot   ? ? ?Plan: We will place her in a short fracture boot with follow-up in 1 week.  The calf is 36 cm in circumference.  Sutures harvested today. ? ?Follow-Up Instructions: Return in about 1 week (around 03/18/2022).  ? ?Ortho Exam ? ?Patient is alert, oriented, no adenopathy, well-dressed, normal affect, normal respiratory effort. ?Examination patient does have increased swelling there is no cellulitis no drainage.  Sutures are harvested. ? ?Imaging: ?No results found. ?No images are attached to the encounter. ? ?Labs: ?Lab Results  ?Component Value Date  ? REPTSTATUS 07/27/2019 FINAL 07/25/2019  ? CULT (A) 07/25/2019  ?  <10,000 COLONIES/mL INSIGNIFICANT GROWTH ?Performed at [facility], [place] [address], [place],  [address] ?  ? ? ? ?Lab Results  ?Component Value Date  ? ALBUMIN 4.0 07/25/2019  ? ? ?No results found for: MG ?No results found for: VD25OH ? ?No results found for: PREALBUMIN ? ?  Latest Ref Rng & Units 03/01/2022  ?  5:53 AM 07/25/2019  ? 10:10 PM  ?CBC EXTENDED  ?WBC 4.0 - 10.5 K/uL 6.1   12.8    ?RBC 3.87 - 5.11 MIL/uL 4.74   5.00    ?Hemoglobin 12.0 - 15.0 g/dL 13.9   13.9    ?HCT 36.0 - 46.0 % 40.9   43.0    ?Platelets 150 - 400 K/uL 190   191    ?NEUT# 1.7 - 7.7 K/uL   10.5    ?Lymph# 0.7 - 4.0 K/uL  1.5    ? ? ? ?There is no height or weight on file to calculate BMI. ? ?Orders:  ?Orders Placed This Encounter  ?Procedures  ? XR Foot Complete Left  ? ?No orders of the defined types were placed in this encounter. ? ? ? Procedures: ?No procedures performed ? ?Clinical Data: ?No additional findings. ? ?ROS: ? ?All other systems negative, except as noted in the HPI. ?Review of Systems ? ?Objective: ?Vital Signs: LMP 10/21/2016  ? ?Specialty Comments:  ?No specialty comments available. ? ?PMFS History: ?Patient Active Problem List  ? Diagnosis Date Noted  ? Posterior tibial tendon dysfunction (PTTD) of left lower extremity   ? Dysuria 08/13/2021  ? Eczema 07/12/2021  ? Herpes simplex 07/12/2021  ? Hypocalcemia 07/12/2021  ? Overweight 07/12/2021  ? Mixed hyperlipidemia 07/07/2021  ? PMB (postmenopausal bleeding) 06/18/2021  ? Papanicolaou smear of cervix with positive high risk human papilloma virus (HPV) test 06/18/2021  ? Generalized anxiety disorder 06/11/2021  ? ?Past Medical History:  ?Diagnosis Date  ? Anemia   ?  iron deficency in young adult life.  ? Anxiety   ? Arthritis   ? Depression   ? Herpes   ? Papanicolaou smear of cervix with positive high risk human papilloma virus (HPV) test 06/18/2021  ? Repeat pap in 1 year   ?  ?Family History  ?Problem Relation Age of Onset  ? Skin cancer Father   ? Breast cancer Mother   ? Peripheral vascular disease Mother   ?  ?Past Surgical History:  ?Procedure Laterality Date  ? CHOLECYSTECTOMY    ? COLONOSCOPY WITH PROPOFOL N/A 11/10/2020  ? Procedure: COLONOSCOPY WITH PROPOFOL;  Surgeon: Eloise Harman, DO;  [facility];  Service: Endoscopy;  Laterality: N/A;  7:30  ? FOOT ARTHRODESIS Left 03/01/2022  ? Procedure: LEFT TALONAVICULAR AND SUBTALAR FUSION;  Surgeon: Newt Minion, MD;  Location: [facility];  Service: Orthopedics;  Laterality: Left;  ? LEG SURGERY    ? POLYPECTOMY  11/10/2020  ? Procedure: POLYPECTOMY;  Surgeon:  Eloise Harman, DO;  [facility];  Service: Endoscopy;;  ? ?Social History  ? ?Occupational History  ? Not on file  ?Tobacco Use  ? Smoking status: Never  ? Smokeless tobacco: Never  ?Vaping Use  ? Vaping Use: Never used  ?Substance and Sexual Activity  ? Alcohol use: No  ? Drug use: Never  ? Sexual activity: Not Currently  ?  Birth control/protection: Post-menopausal  ? ? ? ? ? ?

## 2022-03-18 ENCOUNTER — Encounter: Payer: Self-pay | Admitting: Orthopedic Surgery

## 2022-03-18 ENCOUNTER — Ambulatory Visit (INDEPENDENT_AMBULATORY_CARE_PROVIDER_SITE_OTHER): Payer: 59 | Admitting: Orthopedic Surgery

## 2022-03-18 ENCOUNTER — Ambulatory Visit (INDEPENDENT_AMBULATORY_CARE_PROVIDER_SITE_OTHER): Payer: 59

## 2022-03-18 DIAGNOSIS — M79672 Pain in left foot: Secondary | ICD-10-CM

## 2022-03-18 DIAGNOSIS — M76822 Posterior tibial tendinitis, left leg: Secondary | ICD-10-CM

## 2022-03-18 NOTE — Progress Notes (Signed)
? ?Office Visit Note ?  ?Patient: Felicia Daniel           ?Date of Birth: [birth year]           ?MRN: [id number] ?Visit Date: 03/18/2022 ?             ?Requested by: Celene Squibb, MD ?[address] ?[name] ?[place],  [place] [address] ?PCP: Celene Squibb, MD ? ?Chief Complaint  ?Patient presents with  ? Left Foot - Routine Post Op  ?  S/p left talonavicular and subtalar fusion 03/01/22.  ? ? ? ? ?HPI: ?Patient is a 61 year old woman who is 2 weeks status post left talonavicular and subtalar fusion.  She is nonweightbearing with crutches.  Patient states she feels well has not used any pain medicine. ? ?Assessment & Plan: ?Visit Diagnoses:  ?1. Left foot pain   ?2. Posterior tibial tendon dysfunction (PTTD) of left lower extremity   ? ? ?Plan: Sutures harvested today continue nonweightbearing for 2 more weeks she will work on range of motion of her ankle and her toes she will start wearing her compression sock. ? ?Follow-Up Instructions: Return in about 2 weeks (around 04/01/2022).  ? ?Ortho Exam ? ?Patient is alert, oriented, no adenopathy, well-dressed, normal affect, normal respiratory effort. ?Examination the incisions are well-healed no cellulitis no drainage no wound dehiscence.  Sutures are harvested. ? ?Imaging: ?XR Foot Complete Left ? ?Result Date: 03/18/2022 ?Three-view radiographs of the left foot shows stable internal fixation of the subtalar and talonavicular joint.  ?No images are attached to the encounter. ? ?Labs: ?Lab Results  ?Component Value Date  ? REPTSTATUS 07/27/2019 FINAL 07/25/2019  ? CULT (A) 07/25/2019  ?  <10,000 COLONIES/mL INSIGNIFICANT GROWTH ?Performed at [facility], [place] [address] [place], [place] [address] ?  ? ? ? ?Lab Results  ?Component Value Date  ? ALBUMIN 4.0 07/25/2019  ? ? ?No results found for: MG ?No results found for: VD25OH ? ?No results found for: PREALBUMIN ? ?  Latest Ref Rng & Units 03/01/2022  ?  5:53 AM 07/25/2019  ? 10:10 PM  ?CBC EXTENDED  ?WBC 4.0 - 10.5 K/uL 6.1    12.8    ?RBC 3.87 - 5.11 MIL/uL 4.74   5.00    ?Hemoglobin 12.0 - 15.0 g/dL 13.9   13.9    ?HCT 36.0 - 46.0 % 40.9   43.0    ?Platelets 150 - 400 K/uL 190   191    ?NEUT# 1.7 - 7.7 K/uL  10.5    ?Lymph# 0.7 - 4.0 K/uL  1.5    ? ? ? ?There is no height or weight on file to calculate BMI. ? ?Orders:  ?Orders Placed This Encounter  ?Procedures  ? XR Foot Complete Left  ? ?No orders of the defined types were placed in this encounter. ? ? ? Procedures: ?No procedures performed ? ?Clinical Data: ?No additional findings. ? ?ROS: ? ?All other systems negative, except as noted in the HPI. ?Review of Systems ? ?Objective: ?Vital Signs: LMP 10/21/2016  ? ?Specialty Comments:  ?No specialty comments available. ? ?PMFS History: ?Patient Active Problem List  ? Diagnosis Date Noted  ? Posterior tibial tendon dysfunction (PTTD) of left lower extremity   ? Dysuria 08/13/2021  ? Eczema 07/12/2021  ? Herpes simplex 07/12/2021  ? Hypocalcemia 07/12/2021  ? Overweight 07/12/2021  ? Mixed hyperlipidemia 07/07/2021  ? PMB (postmenopausal bleeding) 06/18/2021  ? Papanicolaou smear of cervix with positive high  risk human papilloma virus (HPV) test 06/18/2021  ? Generalized anxiety disorder 06/11/2021  ? ?Past Medical History:  ?Diagnosis Date  ? Anemia   ? iron deficency in young adult life.  ? Anxiety   ? Arthritis   ? Depression   ? Herpes   ? Papanicolaou smear of cervix with positive high risk human papilloma virus (HPV) test 06/18/2021  ? Repeat pap in 1 year   ?  ?Family History  ?Problem Relation Age of Onset  ? Skin cancer Father   ? Breast cancer Mother   ? Peripheral vascular disease Mother   ?  ?Past Surgical History:  ?Procedure Laterality Date  ? CHOLECYSTECTOMY    ? COLONOSCOPY WITH PROPOFOL N/A 11/10/2020  ? Procedure: COLONOSCOPY WITH PROPOFOL;  Surgeon: Eloise Harman, DO;  [facility];  Service: Endoscopy;  Laterality: N/A;  7:30  ? FOOT ARTHRODESIS Left 03/01/2022  ? Procedure: LEFT TALONAVICULAR AND  SUBTALAR FUSION;  Surgeon: Newt Minion, MD;  Location: [facility];  Service: Orthopedics;  Laterality: Left;  ? LEG SURGERY    ? POLYPECTOMY  11/10/2020  ? Procedure: POLYPECTOMY;  Surgeon: Eloise Harman, DO;  [facility];  Service: Endoscopy;;  ? ?Social History  ? ?Occupational History  ? Not on file  ?Tobacco Use  ? Smoking status: Never  ? Smokeless tobacco: Never  ?Vaping Use  ? Vaping Use: Never used  ?Substance and Sexual Activity  ? Alcohol use: No  ? Drug use: Never  ? Sexual activity: Not Currently  ?  Birth control/protection: Post-menopausal  ? ? ? ? ? ?

## 2022-03-25 ENCOUNTER — Telehealth: Payer: Self-pay | Admitting: Orthopedic Surgery

## 2022-03-25 NOTE — Telephone Encounter (Signed)
Voice message received from representative asking about ecords/form requests fo this patient  [[id number]] from [facility], ref# [id number]. Requests were for records 09/23/21 through present, initially req'd on 03/05/22 to fax [phone] / ph [phone],. ( [employer] may have responded to the request?) ?

## 2022-04-01 ENCOUNTER — Encounter: Payer: Self-pay | Admitting: Orthopedic Surgery

## 2022-04-01 ENCOUNTER — Ambulatory Visit (INDEPENDENT_AMBULATORY_CARE_PROVIDER_SITE_OTHER): Payer: 59 | Admitting: Orthopedic Surgery

## 2022-04-01 DIAGNOSIS — M76822 Posterior tibial tendinitis, left leg: Secondary | ICD-10-CM

## 2022-04-01 NOTE — Progress Notes (Signed)
? ?Office Visit Note ?  ?Patient: Felicia Daniel           ?Date of Birth: Jun 30, 1961           ?MRN: [id number] ?Visit Date: 04/01/2022 ?             ?Requested by: Celene Squibb, MD ?[address] ?[name] ?[place],  [place] [address] ?PCP: Celene Squibb, MD ? ?Chief Complaint  ?Patient presents with  ? Left Foot - Routine Post Op  ?  03/01/22 left tal/sub fusion  ? ? ? ? ?HPI: ?Patient is a 60 year old woman who presents 4 weeks status post left subtalar and talonavicular fusion.  Patient is requiring if she can return to work in 2 weeks on a standing job. ? ?Assessment & Plan: ?Visit Diagnoses:  ?1. Posterior tibial tendon dysfunction (PTTD) of left lower extremity   ? ? ?Plan: Patient is given a note and recommended 4 additional weeks out of work which would put her 2 months out from surgery.  Anticipate that she could start her standing work at that time. ? ?Three-view radiographs of the left foot at follow-up. ? ?Follow-Up Instructions: Return in about 3 weeks (around 04/22/2022).  ? ?Ortho Exam ? ?Patient is alert, oriented, no adenopathy, well-dressed, normal affect, normal respiratory effort. ?Examination there is no swelling she has good range of motion of her ankle she has good pulses good range of motion of the toes.  Patient has a new petechial rash from mid thigh to the left ankle.  There is no redness no cellulitis no tenderness to palpation there is no itching or burning.  This does not appear to be related to her compression sock. ? ?Imaging: ?No results found. ?No images are attached to the encounter. ? ?Labs: ?Lab Results  ?Component Value Date  ? REPTSTATUS 07/27/2019 FINAL 07/25/2019  ? CULT (A) 07/25/2019  ?  <10,000 COLONIES/mL INSIGNIFICANT GROWTH ?Performed at [facility], [place] [address] [place], [place] [address] ?  ? ? ? ?Lab Results  ?Component Value Date  ? ALBUMIN 4.0 07/25/2019  ? ? ?No results found for: MG ?No results found for: VD25OH ? ?No results found for: PREALBUMIN ? ?  Latest Ref  Rng & Units 03/01/2022  ?  5:53 AM 07/25/2019  ? 10:10 PM  ?CBC EXTENDED  ?WBC 4.0 - 10.5 K/uL 6.1   12.8    ?RBC 3.87 - 5.11 MIL/uL 4.74   5.00    ?Hemoglobin 12.0 - 15.0 g/dL 13.9   13.9    ?HCT 36.0 - 46.0 % 40.9   43.0    ?Platelets 150 - 400 K/uL 190   191    ?NEUT# 1.7 - 7.7 K/uL  10.5    ?Lymph# 0.7 - 4.0 K/uL  1.5    ? ? ? ?There is no height or weight on file to calculate BMI. ? ?Orders:  ?No orders of the defined types were placed in this encounter. ? ?No orders of the defined types were placed in this encounter. ? ? ? Procedures: ?No procedures performed ? ?Clinical Data: ?No additional findings. ? ?ROS: ? ?All other systems negative, except as noted in the HPI. ?Review of Systems ? ?Objective: ?Vital Signs: LMP 10/21/2016  ? ?Specialty Comments:  ?No specialty comments available. ? ?PMFS History: ?Patient Active Problem List  ? Diagnosis Date Noted  ? Posterior tibial tendon dysfunction (PTTD) of left lower extremity   ? Dysuria 08/13/2021  ? Eczema 07/12/2021  ?  Herpes simplex 07/12/2021  ? Hypocalcemia 07/12/2021  ? Overweight 07/12/2021  ? Mixed hyperlipidemia 07/07/2021  ? PMB (postmenopausal bleeding) 06/18/2021  ? Papanicolaou smear of cervix with positive high risk human papilloma virus (HPV) test 06/18/2021  ? Generalized anxiety disorder 06/11/2021  ? ?Past Medical History:  ?Diagnosis Date  ? Anemia   ? iron deficency in young adult life.  ? Anxiety   ? Arthritis   ? Depression   ? Herpes   ? Papanicolaou smear of cervix with positive high risk human papilloma virus (HPV) test 06/18/2021  ? Repeat pap in 1 year   ?  ?Family History  ?Problem Relation Age of Onset  ? Skin cancer Father   ? Breast cancer Mother   ? Peripheral vascular disease Mother   ?  ?Past Surgical History:  ?Procedure Laterality Date  ? CHOLECYSTECTOMY    ? COLONOSCOPY WITH PROPOFOL N/A 11/10/2020  ? Procedure: COLONOSCOPY WITH PROPOFOL;  Surgeon: Eloise Harman, DO;  [facility];  Service: Endoscopy;   Laterality: N/A;  7:30  ? FOOT ARTHRODESIS Left 03/01/2022  ? Procedure: LEFT TALONAVICULAR AND SUBTALAR FUSION;  Surgeon: Newt Minion, MD;  Location: [facility];  Service: Orthopedics;  Laterality: Left;  ? LEG SURGERY    ? POLYPECTOMY  11/10/2020  ? Procedure: POLYPECTOMY;  Surgeon: Eloise Harman, DO;  [facility];  Service: Endoscopy;;  ? ?Social History  ? ?Occupational History  ? Not on file  ?Tobacco Use  ? Smoking status: Never  ? Smokeless tobacco: Never  ?Vaping Use  ? Vaping Use: Never used  ?Substance and Sexual Activity  ? Alcohol use: No  ? Drug use: Never  ? Sexual activity: Not Currently  ?  Birth control/protection: Post-menopausal  ? ? ? ? ? ?

## 2022-04-29 ENCOUNTER — Encounter: Payer: Self-pay | Admitting: Orthopedic Surgery

## 2022-04-29 ENCOUNTER — Ambulatory Visit (INDEPENDENT_AMBULATORY_CARE_PROVIDER_SITE_OTHER): Payer: Commercial Managed Care - PPO | Admitting: Orthopedic Surgery

## 2022-04-29 ENCOUNTER — Telehealth: Payer: Self-pay | Admitting: Orthopedic Surgery

## 2022-04-29 DIAGNOSIS — M76822 Posterior tibial tendinitis, left leg: Secondary | ICD-10-CM

## 2022-04-29 NOTE — Telephone Encounter (Signed)
Pt called requesting a handicap placard. Please call pt when ready for pick up. Pt phone number is [phone]. ?

## 2022-04-29 NOTE — Progress Notes (Signed)
? ?Office Visit Note ?  ?Patient: Felicia Daniel           ?Date of Birth: [birth year]           ?MRN: [id number] ?Visit Date: 04/29/2022 ?             ?Requested by: Celene Squibb, MD ?[address] ?[name] ?[place],  [place] [address] ?PCP: Celene Squibb, MD ? ?Chief Complaint  ?Patient presents with  ? Left Foot - Routine Post Op  ?  03/01/22 left subtalar and talonavicular fusion  ? ? ? ? ?HPI: ?Patient is a 61 year old woman who was seen 7 weeks status post left subtalar fusion.  She states that she occasionally has some electrical type pain. ? ?Assessment & Plan: ?Visit Diagnoses:  ?1. Posterior tibial tendon dysfunction (PTTD) of left lower extremity   ? ? ?Plan: Patient will begin weightbearing as tolerated in the fracture boot she will work on range of motion of the ankle and her toes. ? ?Follow-Up Instructions: Return in about 4 weeks (around 05/27/2022).  ? ?Ortho Exam ? ?Patient is alert, oriented, no adenopathy, well-dressed, normal affect, normal respiratory effort. ?The incisions have healed nicely there is minimal swelling no cellulitis. ? ?Imaging: ?No results found. ?No images are attached to the encounter. ? ?Labs: ?Lab Results  ?Component Value Date  ? REPTSTATUS 07/27/2019 FINAL 07/25/2019  ? CULT (A) 07/25/2019  ?  <10,000 COLONIES/mL INSIGNIFICANT GROWTH ?Performed at [facility], [place] [address] [place], [place] [address] ?  ? ? ? ?Lab Results  ?Component Value Date  ? ALBUMIN 4.0 07/25/2019  ? ? ?No results found for: MG ?No results found for: VD25OH ? ?No results found for: PREALBUMIN ? ?  Latest Ref Rng & Units 03/01/2022  ?  5:53 AM 07/25/2019  ? 10:10 PM  ?CBC EXTENDED  ?WBC 4.0 - 10.5 K/uL 6.1   12.8    ?RBC 3.87 - 5.11 MIL/uL 4.74   5.00    ?Hemoglobin 12.0 - 15.0 g/dL 13.9   13.9    ?HCT 36.0 - 46.0 % 40.9   43.0    ?Platelets 150 - 400 K/uL 190   191    ?NEUT# 1.7 - 7.7 K/uL  10.5    ?Lymph# 0.7 - 4.0 K/uL  1.5    ? ? ? ?There is no height or weight on file to calculate BMI. ? ?Orders:  ?No  orders of the defined types were placed in this encounter. ? ?No orders of the defined types were placed in this encounter. ? ? ? Procedures: ?No procedures performed ? ?Clinical Data: ?No additional findings. ? ?ROS: ? ?All other systems negative, except as noted in the HPI. ?Review of Systems ? ?Objective: ?Vital Signs: LMP 10/21/2016  ? ?Specialty Comments:  ?No specialty comments available. ? ?PMFS History: ?Patient Active Problem List  ? Diagnosis Date Noted  ? Posterior tibial tendon dysfunction (PTTD) of left lower extremity   ? Dysuria 08/13/2021  ? Eczema 07/12/2021  ? Herpes simplex 07/12/2021  ? Hypocalcemia 07/12/2021  ? Overweight 07/12/2021  ? Mixed hyperlipidemia 07/07/2021  ? PMB (postmenopausal bleeding) 06/18/2021  ? Papanicolaou smear of cervix with positive high risk human papilloma virus (HPV) test 06/18/2021  ? Generalized anxiety disorder 06/11/2021  ? ?Past Medical History:  ?Diagnosis Date  ? Anemia   ? iron deficency in young adult life.  ? Anxiety   ? Arthritis   ? Depression   ? Herpes   ?  Papanicolaou smear of cervix with positive high risk human papilloma virus (HPV) test 06/18/2021  ? Repeat pap in 1 year   ?  ?Family History  ?Problem Relation Age of Onset  ? Skin cancer Father   ? Breast cancer Mother   ? Peripheral vascular disease Mother   ?  ?Past Surgical History:  ?Procedure Laterality Date  ? CHOLECYSTECTOMY    ? COLONOSCOPY WITH PROPOFOL N/A 11/10/2020  ? Procedure: COLONOSCOPY WITH PROPOFOL;  Surgeon: Eloise Harman, DO;  [facility];  Service: Endoscopy;  Laterality: N/A;  7:30  ? FOOT ARTHRODESIS Left 03/01/2022  ? Procedure: LEFT TALONAVICULAR AND SUBTALAR FUSION;  Surgeon: Newt Minion, MD;  Location: [facility];  Service: Orthopedics;  Laterality: Left;  ? LEG SURGERY    ? POLYPECTOMY  11/10/2020  ? Procedure: POLYPECTOMY;  Surgeon: Eloise Harman, DO;  [facility];  Service: Endoscopy;;  ? ?Social History  ? ?Occupational History  ? Not on file   ?Tobacco Use  ? Smoking status: Never  ? Smokeless tobacco: Never  ?Vaping Use  ? Vaping Use: Never used  ?Substance and Sexual Activity  ? Alcohol use: No  ? Drug use: Never  ? Sexual activity: Not Currently  ?  Birth control/protection: Post-menopausal  ? ? ? ? ? ?

## 2022-04-30 NOTE — Telephone Encounter (Signed)
Pending MD signature, will call pt once ready for p/u. ?

## 2022-05-01 NOTE — Telephone Encounter (Signed)
Ready for p/u, called pt, no answer and VM box is full. ?

## 2022-05-01 NOTE — Telephone Encounter (Signed)
Pt notified. Form is at front desk ready for p/u ?

## 2022-05-27 ENCOUNTER — Ambulatory Visit (INDEPENDENT_AMBULATORY_CARE_PROVIDER_SITE_OTHER): Payer: Commercial Managed Care - PPO | Admitting: Orthopedic Surgery

## 2022-05-27 DIAGNOSIS — M76822 Posterior tibial tendinitis, left leg: Secondary | ICD-10-CM

## 2022-05-28 ENCOUNTER — Encounter: Payer: Self-pay | Admitting: Orthopedic Surgery

## 2022-05-28 NOTE — Progress Notes (Signed)
Office Visit Note   Patient: Felicia Daniel           Date of Birth: 10/11/1961           MRN: [id number] Visit Date: 05/27/2022              Requested by: Celene Squibb, MD [address] [place],  [place] [address] PCP: Celene Squibb, MD  Chief Complaint  Patient presents with   Left Foot - Routine Post Op    06/01/22 left subtalar and talonavicular fusion       HPI: Patient is a 61 year old woman who is 3 months status post left subtalar and talonavicular fusion she is full weightbearing in a short fracture boot she is wearing compression socks working on range of motion of the ankle and toes.  Patient states she had a dime sized amount of drainage on the sock last week.  She denies any redness or cellulitis today.  Patient states she has numbness on the lateral side of her leg.  Assessment & Plan: Visit Diagnoses:  1. Posterior tibial tendon dysfunction (PTTD) of left lower extremity     Plan: Continue to increase activities as tolerated advance to regular shoewear as tolerated.  Repeat three-view radiographs of the left foot at follow-up.  Follow-Up Instructions: Return in about 4 weeks (around 06/24/2022).   Ortho Exam  Patient is alert, oriented, no adenopathy, well-dressed, normal affect, normal respiratory effort. Examination she still has some swelling there is no drainage no cellulitis no pain with range of motion of her toes or ankle.     Imaging: No results found. No images are attached to the encounter.  Labs: Lab Results  Component Value Date   REPTSTATUS 07/27/2019 FINAL 07/25/2019   CULT (A) 07/25/2019    <10,000 COLONIES/mL INSIGNIFICANT GROWTH Performed at [facility] [address], [place], [place] [address]      Lab Results  Component Value Date   ALBUMIN 4.0 07/25/2019    No results found for: MG No results found for: VD25OH  No results found for: PREALBUMIN    Latest Ref Rng & Units 03/01/2022    5:53 AM 07/25/2019   10:10 PM   CBC EXTENDED  WBC 4.0 - 10.5 K/uL 6.1   12.8    RBC 3.87 - 5.11 MIL/uL 4.74   5.00    Hemoglobin 12.0 - 15.0 g/dL 13.9   13.9    HCT 36.0 - 46.0 % 40.9   43.0    Platelets 150 - 400 K/uL 190   191    NEUT# 1.7 - 7.7 K/uL  10.5    Lymph# 0.7 - 4.0 K/uL  1.5       There is no height or weight on file to calculate BMI.  Orders:  No orders of the defined types were placed in this encounter.  No orders of the defined types were placed in this encounter.    Procedures: No procedures performed  Clinical Data: No additional findings.  ROS:  All other systems negative, except as noted in the HPI. Review of Systems  Objective: Vital Signs: LMP 10/21/2016   Specialty Comments:  No specialty comments available.  PMFS History: Patient Active Problem List   Diagnosis Date Noted   Posterior tibial tendon dysfunction (PTTD) of left lower extremity    Dysuria 08/13/2021   Eczema 07/12/2021   Herpes simplex 07/12/2021   Hypocalcemia 07/12/2021   Overweight 07/12/2021   Mixed hyperlipidemia  07/07/2021   PMB (postmenopausal bleeding) 06/18/2021   Papanicolaou smear of cervix with positive high risk human papilloma virus (HPV) test 06/18/2021   Generalized anxiety disorder 06/11/2021   Past Medical History:  Diagnosis Date   Anemia    iron deficency in young adult life.   Anxiety    Arthritis    Depression    Herpes    Papanicolaou smear of cervix with positive high risk human papilloma virus (HPV) test 06/18/2021   Repeat pap in 1 year     Family History  Problem Relation Age of Onset   Skin cancer Father    Breast cancer Mother    Peripheral vascular disease Mother     Past Surgical History:  Procedure Laterality Date   CHOLECYSTECTOMY     COLONOSCOPY WITH PROPOFOL N/A 11/10/2020   Procedure: COLONOSCOPY WITH PROPOFOL;  Surgeon: Eloise Harman, DO;  [facility];  Service: Endoscopy;  Laterality: N/A;  7:30   FOOT ARTHRODESIS Left 03/01/2022    Procedure: LEFT TALONAVICULAR AND SUBTALAR FUSION;  Surgeon: Newt Minion, MD;  Location: [facility];  Service: Orthopedics;  Laterality: Left;   LEG SURGERY     POLYPECTOMY  11/10/2020   Procedure: POLYPECTOMY;  Surgeon: Eloise Harman, DO;  [facility];  Service: Endoscopy;;   Social History   Occupational History   Not on file  Tobacco Use   Smoking status: Never   Smokeless tobacco: Never  Vaping Use   Vaping Use: Never used  Substance and Sexual Activity   Alcohol use: No   Drug use: Never   Sexual activity: Not Currently    Birth control/protection: Post-menopausal

## 2022-06-24 ENCOUNTER — Ambulatory Visit (INDEPENDENT_AMBULATORY_CARE_PROVIDER_SITE_OTHER): Payer: Commercial Managed Care - PPO

## 2022-06-24 ENCOUNTER — Ambulatory Visit (INDEPENDENT_AMBULATORY_CARE_PROVIDER_SITE_OTHER): Payer: Commercial Managed Care - PPO | Admitting: Orthopedic Surgery

## 2022-06-24 DIAGNOSIS — M7671 Peroneal tendinitis, right leg: Secondary | ICD-10-CM | POA: Diagnosis not present

## 2022-06-24 DIAGNOSIS — M76822 Posterior tibial tendinitis, left leg: Secondary | ICD-10-CM

## 2022-06-26 ENCOUNTER — Encounter: Payer: Self-pay | Admitting: Orthopedic Surgery

## 2022-06-26 NOTE — Progress Notes (Signed)
Office Visit Note   Patient: Felicia Daniel           Date of Birth: [birth year]           MRN: [id number] Visit Date: 06/24/2022              Requested by: Celene Squibb, MD [address] [place],  [place] [address] PCP: Celene Squibb, MD  Chief Complaint  Patient presents with   Left Foot - Follow-up    03/01/22 left tal/sub fusion      HPI: Patient is a 61 year old woman who is seen for peroneal tendinitis on the left foot.  Patient is 15 weeks out from talonavicular and subtalar fusion currently in a cam boot.  Assessment & Plan: Visit Diagnoses:  1. Posterior tibial tendon dysfunction (PTTD) of left lower extremity   2. Peroneal tendinitis, right leg     Plan: Recommended Voltaren gel over the peroneal tendons.  Continue to increase her activities as tolerated she has been in regular shoewear.  Follow-Up Instructions: Return in about 4 weeks (around 07/22/2022).   Ortho Exam  Patient is alert, oriented, no adenopathy, well-dressed, normal affect, normal respiratory effort. Examination patient's foot is plantigrade she has no pain with range of motion of the ankle.  She has pain and swelling to palpation over the peroneal tendons.  Imaging: No results found. No images are attached to the encounter.  Labs: Lab Results  Component Value Date   REPTSTATUS 07/27/2019 FINAL 07/25/2019   CULT (A) 07/25/2019    <10,000 COLONIES/mL INSIGNIFICANT GROWTH Performed at [facility] [address] [place],  [address]      Lab Results  Component Value Date   ALBUMIN 4.0 07/25/2019    No results found for: "MG" No results found for: "VD25OH"  No results found for: "PREALBUMIN"    Latest Ref Rng & Units 03/01/2022    5:53 AM 07/25/2019   10:10 PM  CBC EXTENDED  WBC 4.0 - 10.5 K/uL 6.1  12.8   RBC 3.87 - 5.11 MIL/uL 4.74  5.00   Hemoglobin 12.0 - 15.0 g/dL 13.9  13.9   HCT 36.0 - 46.0 % 40.9  43.0   Platelets 150 - 400 K/uL 190  191   NEUT# 1.7 - 7.7  K/uL  10.5   Lymph# 0.7 - 4.0 K/uL  1.5      There is no height or weight on file to calculate BMI.  Orders:  Orders Placed This Encounter  Procedures   XR Foot Complete Left   No orders of the defined types were placed in this encounter.    Procedures: No procedures performed  Clinical Data: No additional findings.  ROS:  All other systems negative, except as noted in the HPI. Review of Systems  Objective: Vital Signs: LMP 10/21/2016   Specialty Comments:  No specialty comments available.  PMFS History: Patient Active Problem List   Diagnosis Date Noted   Posterior tibial tendon dysfunction (PTTD) of left lower extremity    Dysuria 08/13/2021   Eczema 07/12/2021   Herpes simplex 07/12/2021   Hypocalcemia 07/12/2021   Overweight 07/12/2021   Mixed hyperlipidemia 07/07/2021   PMB (postmenopausal bleeding) 06/18/2021   Papanicolaou smear of cervix with positive high risk human papilloma virus (HPV) test 06/18/2021   Generalized anxiety disorder 06/11/2021   Past Medical History:  Diagnosis Date   Anemia    iron deficency in young adult life.   Anxiety  Arthritis    Depression    Herpes    Papanicolaou smear of cervix with positive high risk human papilloma virus (HPV) test 06/18/2021   Repeat pap in 1 year     Family History  Problem Relation Age of Onset   Skin cancer Father    Breast cancer Mother    Peripheral vascular disease Mother     Past Surgical History:  Procedure Laterality Date   CHOLECYSTECTOMY     COLONOSCOPY WITH PROPOFOL N/A 11/10/2020   Procedure: COLONOSCOPY WITH PROPOFOL;  Surgeon: Eloise Harman, DO;  [facility];  Service: Endoscopy;  Laterality: N/A;  7:30   FOOT ARTHRODESIS Left 03/01/2022   Procedure: LEFT TALONAVICULAR AND SUBTALAR FUSION;  Surgeon: Newt Minion, MD;  Location: [facility];  Service: Orthopedics;  Laterality: Left;   LEG SURGERY     POLYPECTOMY  11/10/2020   Procedure: POLYPECTOMY;  Surgeon:  Eloise Harman, DO;  [facility];  Service: Endoscopy;;   Social History   Occupational History   Not on file  Tobacco Use   Smoking status: Never   Smokeless tobacco: Never  Vaping Use   Vaping Use: Never used  Substance and Sexual Activity   Alcohol use: No   Drug use: Never   Sexual activity: Not Currently    Birth control/protection: Post-menopausal

## 2022-06-27 IMAGING — MG DIGITAL SCREENING BILAT W/ TOMO W/ CAD
8 series · 9 of 24 positions shown · non-contrast
Comparison: None.

CLINICAL DATA: Screening.

EXAM:
DIGITAL SCREENING BILATERAL MAMMOGRAM WITH TOMO AND CAD

[R CC synth-2D]
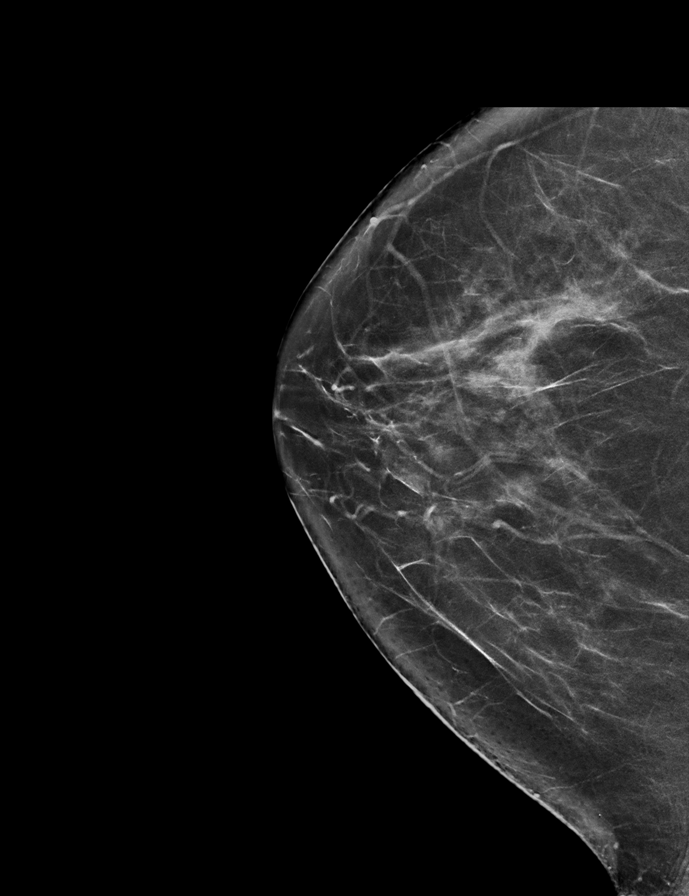

[R MLO synth-2D]
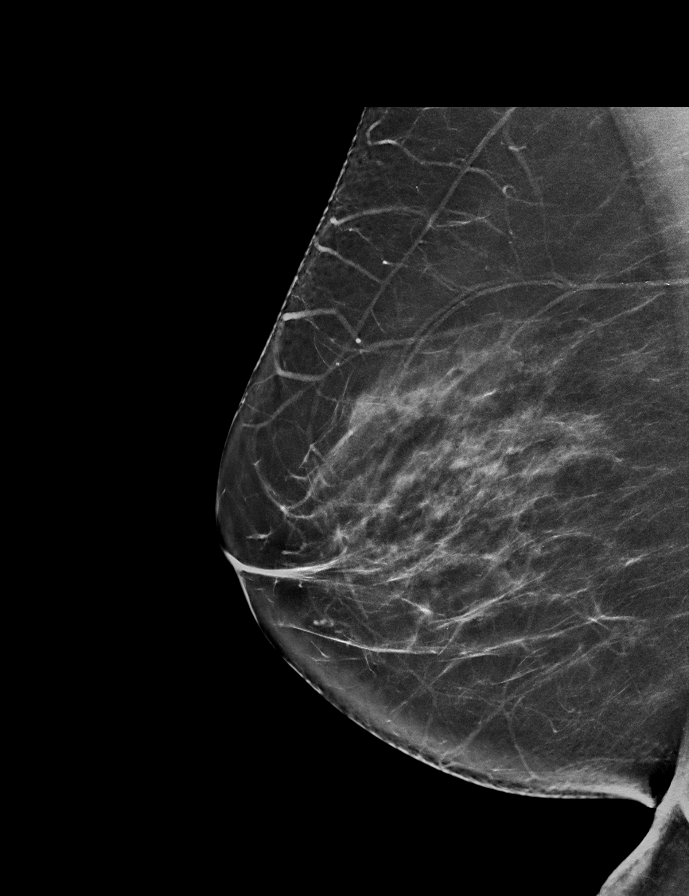

[L MLO synth-2D]
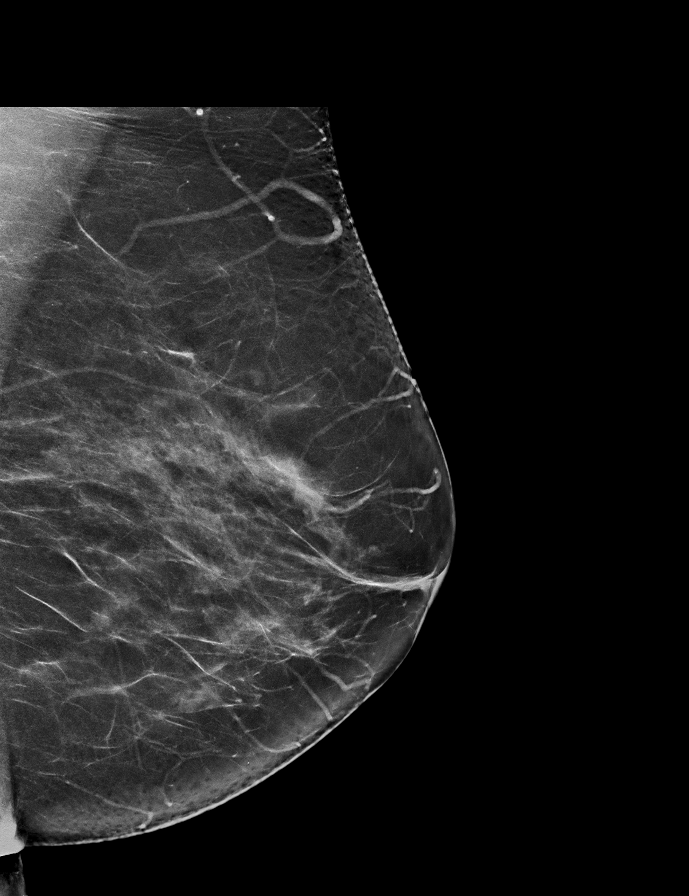

[L CC synth-2D]
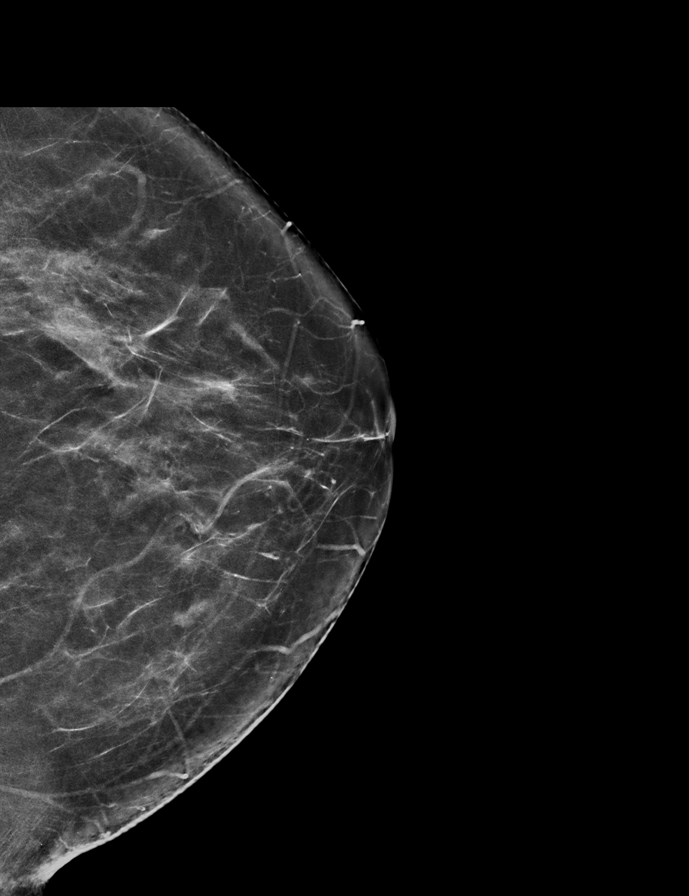

[R CC tomo · 2 of 76 frames shown]
[frame 25/76]
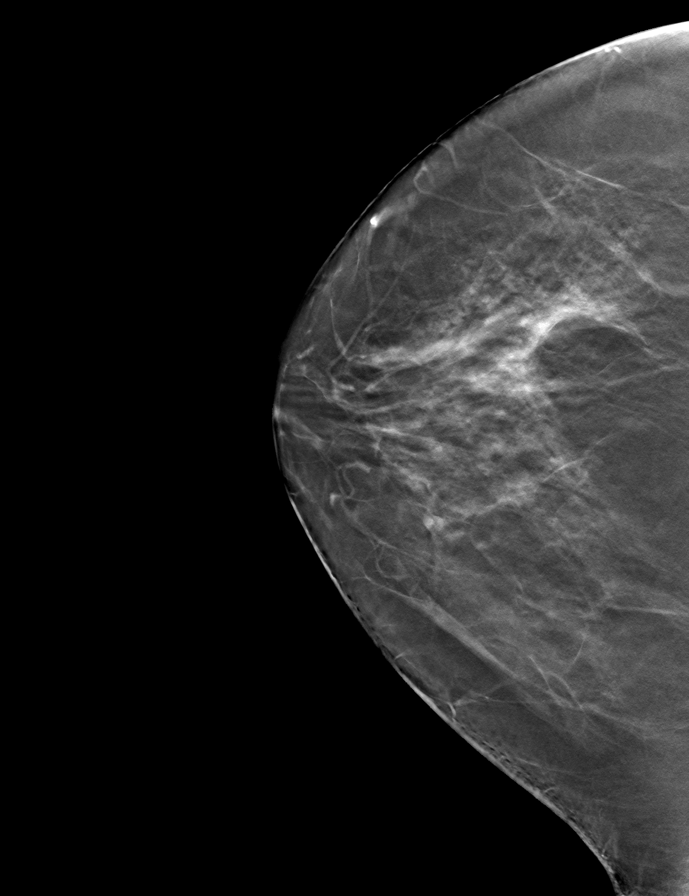
[frame 39/76]
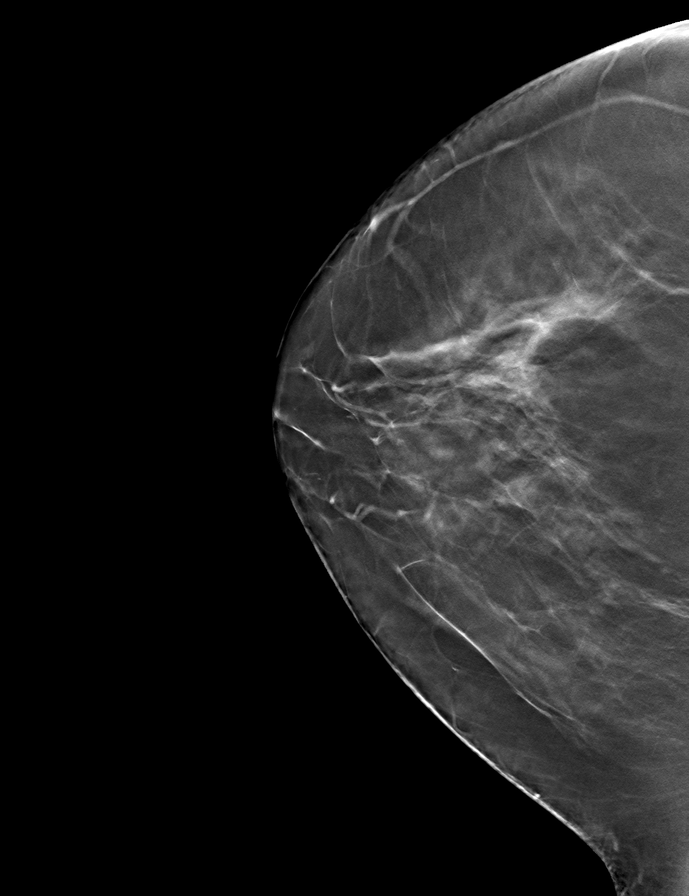

[L MLO tomo · tomo slice 37/74.0]
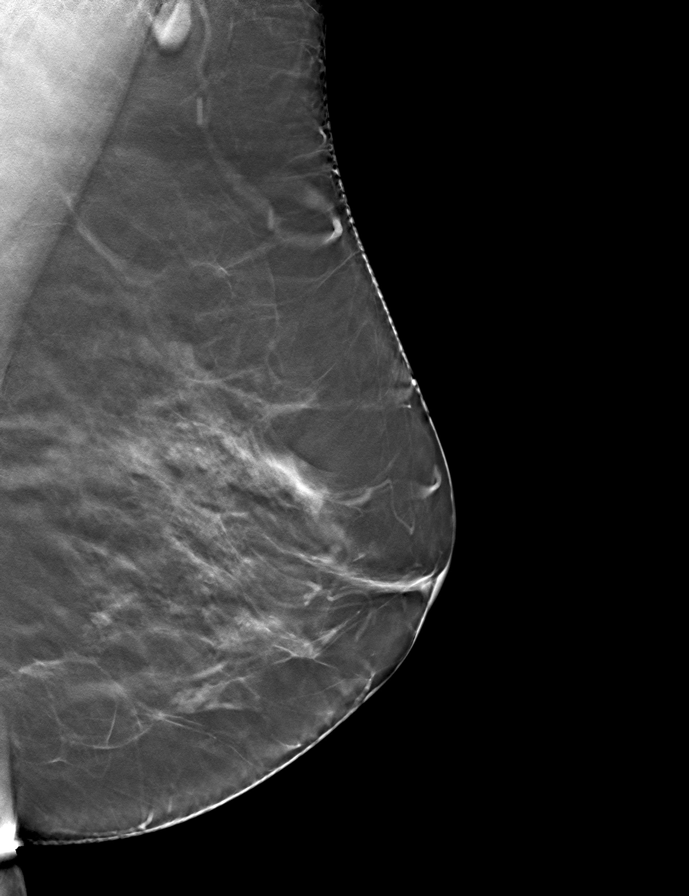

[L CC tomo · tomo slice 37/73.0]
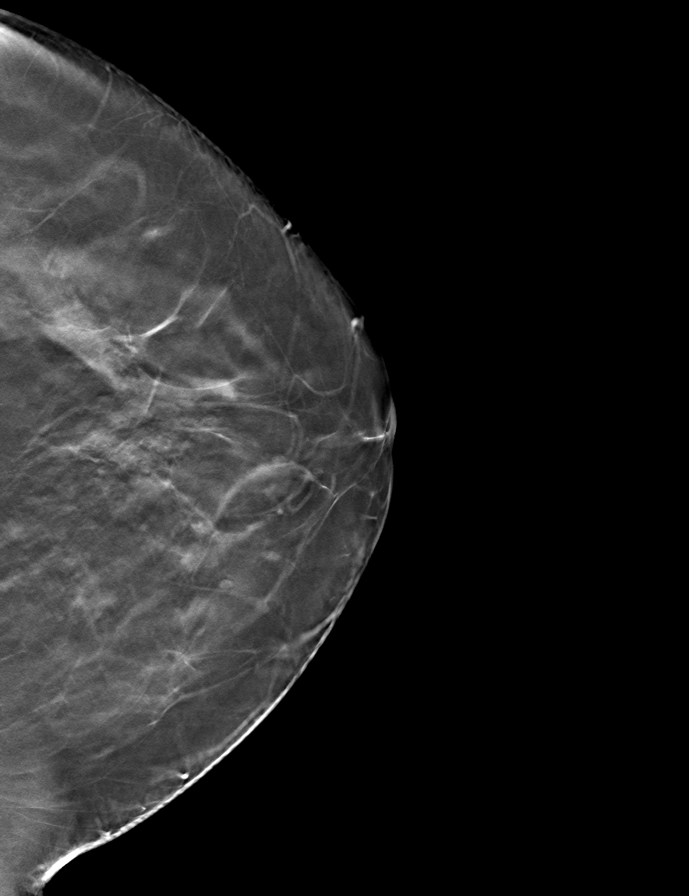

[R MLO tomo · tomo slice 38/75.0]
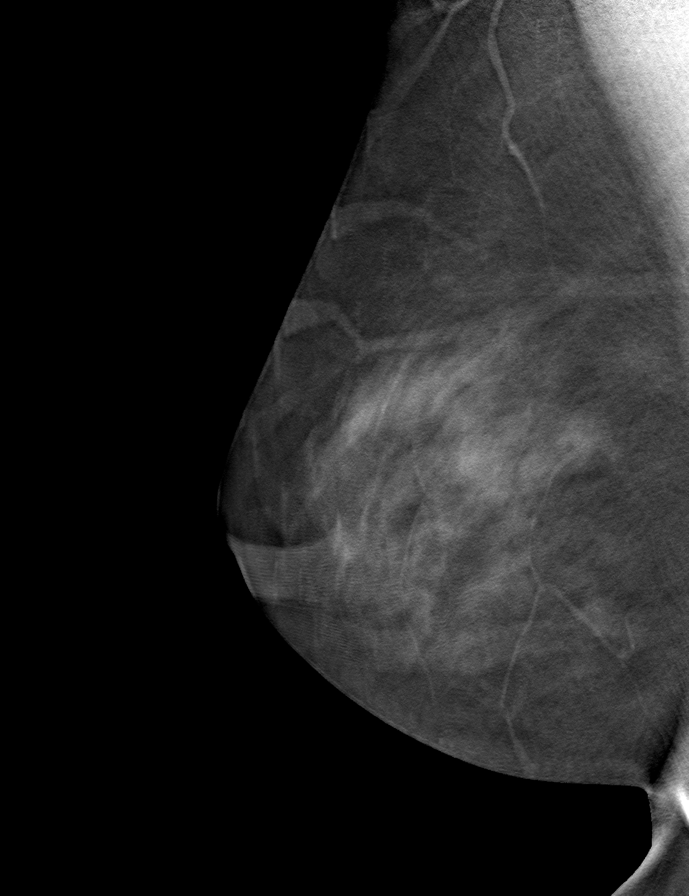

[9 of 24 positions shown; findings below may reference images not displayed]

ACR Breast Density Category c: The breast tissue is heterogeneously
dense, which may obscure small masses.
FINDINGS: In the left breast, a possible asymmetry warrants further
evaluation. In the right breast, no findings suspicious for
malignancy. Images were processed with CAD.
IMPRESSION: Further evaluation is suggested for possible asymmetry in the left
breast.

RECOMMENDATION:
Diagnostic mammogram and possibly ultrasound of the left breast.
(Code:[id number]-[id number])

The patient will be contacted regarding the findings, and additional
imaging will be scheduled.

BI-RADS CATEGORY  0: Incomplete. Need additional imaging evaluation
and/or prior mammograms for comparison.

## 2022-07-23 ENCOUNTER — Ambulatory Visit (INDEPENDENT_AMBULATORY_CARE_PROVIDER_SITE_OTHER): Payer: Commercial Managed Care - PPO | Admitting: Adult Health

## 2022-07-23 ENCOUNTER — Encounter: Payer: Self-pay | Admitting: Adult Health

## 2022-07-23 ENCOUNTER — Other Ambulatory Visit (HOSPITAL_COMMUNITY)
Admission: RE | Admit: 2022-07-23 | Discharge: 2022-07-23 | Disposition: A | Discharge status: Home / Self Care | Payer: Commercial Managed Care - PPO | Source: Ambulatory Visit | Attending: Adult Health | Admitting: Adult Health

## 2022-07-23 VITALS — BP 160/90 | HR 69 | Ht 63.0 in | Wt 167.0 lb

## 2022-07-23 DIAGNOSIS — Z01419 Encounter for gynecological examination (general) (routine) without abnormal findings: Secondary | ICD-10-CM | POA: Insufficient documentation

## 2022-07-23 DIAGNOSIS — Z1211 Encounter for screening for malignant neoplasm of colon: Secondary | ICD-10-CM | POA: Diagnosis not present

## 2022-07-23 DIAGNOSIS — R87810 Cervical high risk human papillomavirus (HPV) DNA test positive: Secondary | ICD-10-CM | POA: Diagnosis not present

## 2022-07-23 LAB — HEMOCCULT GUIAC POC 1CARD (OFFICE): Fecal Occult Blood, POC: NEGATIVE

## 2022-07-23 NOTE — Progress Notes (Signed)
Patient ID: [name], female   DOB: 1961/04/30, 61 y.o.   MRN: [id number] History of Present Illness: Felicia Daniel is a 61 year old white female,married, PM in for a well woman gyn exam and pap. Her pap was HR HPV last year. She had surgery left foot in boot. PCP is Dr Felicia Daniel.    Current Medications, Allergies, Past Medical History, Past Surgical History, Family History and Social History were reviewed in [employer] record.     Review of Systems:  Patient denies any headaches, hearing loss, fatigue, blurred vision, shortness of breath, chest pain, abdominal pain, problems with bowel movements, urination, or intercourse. No joint pain or mood swings.  She denies any vaginal bleeding  Physical Exam:BP (!) 160/90 (BP Location: Left Arm, Patient Position: Sitting, Cuff Size: Normal)   Pulse 69   Ht '5\' 3"'$  (1.6 m)   Wt 167 lb (75.8 kg)   LMP 10/21/2016   BMI 29.58 kg/m   General:  Well developed, well nourished, no acute distress Skin:  Warm and dry Neck:  Midline trachea, normal thyroid, good ROM, no lymphadenopathy,no carotid bruits heard Lungs; Clear to auscultation bilaterally Breast:  No dominant palpable mass, retraction, or nipple discharge Cardiovascular: Regular rate and rhythm Abdomen:  Soft, non tender, no hepatosplenomegaly Pelvic:  External genitalia is normal in appearance, no lesions.  The vagina is pale.Urethra has no lesions or masses. The cervix is has several small nabothian cysts, pap with HR HPV genotyping performed. Marland Kitchen  Uterus is felt to be normal size, shape, and contour.  No adnexal masses or tenderness noted.Bladder is non tender, no masses felt. Rectal: Good sphincter tone, no polyps, or hemorrhoids felt.  Hemoccult negative. Extremities/musculoskeletal:  No swelling or varicosities noted, no clubbing or cyanosis Psych:  No mood changes, alert and cooperative,seems happy AA is 1 Fall risk is low    07/23/2022    9:20 AM 06/18/2021    8:56 AM   Depression screen PHQ 2/9  Decreased Interest 0 2  Down, Depressed, Hopeless 0 3  PHQ - 2 Score 0 5  Altered sleeping 0 1  Tired, decreased energy 0 1  Change in appetite 0 1  Feeling bad or failure about yourself  0 0  Trouble concentrating 1 0  Moving slowly or fidgety/restless 0 0  Suicidal thoughts 0 0  PHQ-9 Score 1 8       07/23/2022    9:20 AM 06/18/2021    8:56 AM  GAD 7 : Generalized Anxiety Score  Nervous, Anxious, on Edge 1 1  Control/stop worrying 0 0  Worry too much - different things 0 1  Trouble relaxing 0 1  Restless 1 0  Easily annoyed or irritable 1 1  Afraid - awful might happen 0 0  Total GAD 7 Score 3 4      Upstream - 07/23/22 0919       Pregnancy Intention Screening   Does the patient want to become pregnant in the next year? No    Does the patient's partner want to become pregnant in the next year? No    Would the patient like to discuss contraceptive options today? No      Contraception Wrap Up   Current Method No Method - Other Reason   postmenopausal   End Method No Method - Other Reason   postmenopausal   Contraception Counseling Provided No             Examination chaperoned by Marcie Bal  Young LPN  Impression and Plan: 1. Encounter for gynecological examination with Papanicolaou smear of cervix Pap sent Pap in 3 years if normal GYN physical in 1 year Labs with PCP Mammogram was normal 12/20/21 She colonoscopy in 2 weeks she says  2. Papanicolaou smear of cervix with positive high risk human papilloma virus (HPV) test Pap sent  3. Encounter for screening fecal occult blood testing Hemoccult was negative

## 2022-07-25 ENCOUNTER — Ambulatory Visit (INDEPENDENT_AMBULATORY_CARE_PROVIDER_SITE_OTHER): Payer: Commercial Managed Care - PPO | Admitting: Orthopedic Surgery

## 2022-07-25 DIAGNOSIS — M76822 Posterior tibial tendinitis, left leg: Secondary | ICD-10-CM

## 2022-07-26 ENCOUNTER — Encounter: Payer: Self-pay | Admitting: Orthopedic Surgery

## 2022-07-26 LAB — CYTOLOGY - PAP
Comment: NEGATIVE
Diagnosis: NEGATIVE
High risk HPV: NEGATIVE

## 2022-07-26 NOTE — Progress Notes (Signed)
Office Visit Note   Patient: Felicia Daniel           Date of Birth: 06-04-1961           MRN: [id number] Visit Date: 07/25/2022              Requested by: Celene Squibb, MD [address] [place],  [place] [address] PCP: Celene Squibb, MD  Chief Complaint  Patient presents with   Left Foot - Follow-up    03/01/22 left tal/sub fusion      HPI: Patient is about 5 months status post left talonavicular and subtalar fusion she has been using Voltaren gel over the peroneal tendons.  She is continuing to increase her activities and in regular shoes.  Assessment & Plan: Visit Diagnoses:  1. Posterior tibial tendon dysfunction (PTTD) of left lower extremity     Plan: Patient request return to work note for August 7.  Continue increase her activities continue with fascial strengthening.  Follow-Up Instructions: Return if symptoms worsen or fail to improve.   Ortho Exam  Patient is alert, oriented, no adenopathy, well-dressed, normal affect, normal respiratory effort. Examination her foot is plantigrade the incisions are well-healed there is minimal swelling.  Patient's symptoms continue to improve slowly.  Imaging: No results found. No images are attached to the encounter.  Labs: Lab Results  Component Value Date   REPTSTATUS 07/27/2019 FINAL 07/25/2019   CULT (A) 07/25/2019    <10,000 COLONIES/mL INSIGNIFICANT GROWTH Performed at [facility] [address] [place], [place] [address]      Lab Results  Component Value Date   ALBUMIN 4.0 07/25/2019    No results found for: "MG" No results found for: "VD25OH"  No results found for: "PREALBUMIN"    Latest Ref Rng & Units 03/01/2022    5:53 AM 07/25/2019   10:10 PM  CBC EXTENDED  WBC 4.0 - 10.5 K/uL 6.1  12.8   RBC 3.87 - 5.11 MIL/uL 4.74  5.00   Hemoglobin 12.0 - 15.0 g/dL 13.9  13.9   HCT 36.0 - 46.0 % 40.9  43.0   Platelets 150 - 400 K/uL 190  191   NEUT# 1.7 - 7.7 K/uL  10.5   Lymph# 0.7 - 4.0 K/uL   1.5      There is no height or weight on file to calculate BMI.  Orders:  No orders of the defined types were placed in this encounter.  No orders of the defined types were placed in this encounter.    Procedures: No procedures performed  Clinical Data: No additional findings.  ROS:  All other systems negative, except as noted in the HPI. Review of Systems  Objective: Vital Signs: LMP 10/21/2016   Specialty Comments:  No specialty comments available.  PMFS History: Patient Active Problem List   Diagnosis Date Noted   Encounter for screening fecal occult blood testing 07/23/2022   Encounter for gynecological examination with Papanicolaou smear of cervix 07/23/2022   Posterior tibial tendon dysfunction (PTTD) of left lower extremity    Dysuria 08/13/2021   Eczema 07/12/2021   Herpes simplex 07/12/2021   Hypocalcemia 07/12/2021   Overweight 07/12/2021   Mixed hyperlipidemia 07/07/2021   PMB (postmenopausal bleeding) 06/18/2021   Papanicolaou smear of cervix with positive high risk human papilloma virus (HPV) test 06/18/2021   Generalized anxiety disorder 06/11/2021   Past Medical History:  Diagnosis Date   Anemia    iron deficency in young  adult life.   Anxiety    Arthritis    Depression    Herpes    Papanicolaou smear of cervix with positive high risk human papilloma virus (HPV) test 06/18/2021   Repeat pap in 1 year     Family History  Problem Relation Age of Onset   Skin cancer Father    Breast cancer Mother    Peripheral vascular disease Mother     Past Surgical History:  Procedure Laterality Date   CHOLECYSTECTOMY     COLONOSCOPY WITH PROPOFOL N/A 11/10/2020   Procedure: COLONOSCOPY WITH PROPOFOL;  Surgeon: Eloise Harman, DO;  [facility];  Service: Endoscopy;  Laterality: N/A;  7:30   FOOT ARTHRODESIS Left 03/01/2022   Procedure: LEFT TALONAVICULAR AND SUBTALAR FUSION;  Surgeon: Newt Minion, MD;  Location: [facility];  Service:  Orthopedics;  Laterality: Left;   LEG SURGERY     POLYPECTOMY  11/10/2020   Procedure: POLYPECTOMY;  Surgeon: Eloise Harman, DO;  [facility];  Service: Endoscopy;;   Social History   Occupational History   Not on file  Tobacco Use   Smoking status: Never   Smokeless tobacco: Never  Vaping Use   Vaping Use: Never used  Substance and Sexual Activity   Alcohol use: Yes    Comment: occ   Drug use: Never   Sexual activity: Yes    Birth control/protection: Post-menopausal

## 2022-08-24 ENCOUNTER — Ambulatory Visit
Admission: EM | Admit: 2022-08-24 | Discharge: 2022-08-24 | Disposition: A | Discharge status: Home / Self Care | Payer: Commercial Managed Care - PPO | Attending: Nurse Practitioner | Admitting: Nurse Practitioner

## 2022-08-24 DIAGNOSIS — H00012 Hordeolum externum right lower eyelid: Secondary | ICD-10-CM | POA: Diagnosis not present

## 2022-08-24 MED ORDER — DOXYCYCLINE HYCLATE 100 MG PO TABS: 100.0000 mg | ORAL_TABLET | Freq: Two times a day (BID) | ORAL | 0 refills | Status: DC

## 2022-08-24 MED ORDER — ERYTHROMYCIN 5 MG/GM OP OINT: 1.0000 | TOPICAL_OINTMENT | Freq: Every day | OPHTHALMIC | 0 refills | Status: AC

## 2022-08-24 NOTE — ED Triage Notes (Signed)
Pt reports pain, redness and swelling in thr right eye. Pt reports she was told by the eye doctor to use hot compress. Stye OTC cream gives no relief.

## 2022-08-24 NOTE — ED Provider Notes (Signed)
[facility]    CSN: [id number] Arrival date & time: 08/24/22  0818      History   Chief Complaint Chief Complaint  Patient presents with   Eye Problem    HPI Felicia Daniel is a 61 y.o. female.   The history is provided by the patient.   Patient presents with pain, redness, and swelling in the right lower eyelid.  Patient states symptoms started 5 days ago.  States that she did go see her eye doctor and was told to use warm compresses.  Since that time, patient reports increased pain and swelling to the right lower lid.  She also states that the swelling has moved to the area under the eyelid and down her cheek.  She denies fever, chills, change in vision, loss of vision, headache, earache.  Patient states she has been using warm compresses as instructed by the eye doctor.  She states last evening, she purchased some over-the-counter stye cream, but has not noticed much relief.  Patient reports that she does wear eyeglasses.  Past Medical History:  Diagnosis Date   Anemia    iron deficency in young adult life.   Anxiety    Arthritis    Depression    Herpes    Papanicolaou smear of cervix with positive high risk human papilloma virus (HPV) test 06/18/2021   Repeat pap in 1 year     Patient Active Problem List   Diagnosis Date Noted   Encounter for screening fecal occult blood testing 07/23/2022   Encounter for gynecological examination with Papanicolaou smear of cervix 07/23/2022   Posterior tibial tendon dysfunction (PTTD) of left lower extremity    Dysuria 08/13/2021   Eczema 07/12/2021   Herpes simplex 07/12/2021   Hypocalcemia 07/12/2021   Overweight 07/12/2021   Mixed hyperlipidemia 07/07/2021   PMB (postmenopausal bleeding) 06/18/2021   Papanicolaou smear of cervix with positive high risk human papilloma virus (HPV) test 06/18/2021   Generalized anxiety disorder 06/11/2021    Past Surgical History:  Procedure Laterality Date   CHOLECYSTECTOMY      COLONOSCOPY WITH PROPOFOL N/A 11/10/2020   Procedure: COLONOSCOPY WITH PROPOFOL;  Surgeon: Eloise Harman, DO;  [facility];  Service: Endoscopy;  Laterality: N/A;  7:30   FOOT ARTHRODESIS Left 03/01/2022   Procedure: LEFT TALONAVICULAR AND SUBTALAR FUSION;  Surgeon: Newt Minion, MD;  Location: [facility];  Service: Orthopedics;  Laterality: Left;   LEG SURGERY     POLYPECTOMY  11/10/2020   Procedure: POLYPECTOMY;  Surgeon: Eloise Harman, DO;  [facility];  Service: Endoscopy;;    OB History     Gravida  2   Para  2   Term  2   Preterm      AB      Living  0      SAB      IAB      Ectopic      Multiple      Live Births               Home Medications    Prior to Admission medications   Medication Sig Start Date End Date Taking? Authorizing Provider  doxycycline (VIBRA-TABS) 100 MG tablet Take 1 tablet (100 mg total) by mouth 2 (two) times daily. 08/24/22  Yes Nadene Witherspoon-Warren, Alda Lea, NP  erythromycin ophthalmic ointment Place 1 Application into the right eye at bedtime for 7 days. 08/24/22 08/31/22 Yes Kalvyn Desa-Warren, Alda Lea, NP  ALPRAZolam (XANAX) 0.5 MG tablet Take 0.5 mg by mouth 2 (two) times daily as needed for anxiety. 06/11/21   [provider]  valACYclovir (VALTREX) 500 MG tablet Take 500 mg by mouth daily.    [provider]    Family History Family History  Problem Relation Age of Onset   Skin cancer Father    Breast cancer Mother    Peripheral vascular disease Mother     Social History Social History   Tobacco Use   Smoking status: Never   Smokeless tobacco: Never  Vaping Use   Vaping Use: Never used  Substance Use Topics   Alcohol use: Yes    Comment: occ   Drug use: Never     Allergies   Dilaudid [hydromorphone]   Review of Systems Review of Systems Per HPI  Physical Exam Triage Vital Signs ED Triage Vitals  Enc Vitals Group     BP 08/24/22 0823 (!) 164/111     Pulse Rate  08/24/22 0823 71     Resp 08/24/22 0823 16     Temp 08/24/22 0823 97.9 F (36.6 C)     Temp Source 08/24/22 0823 Oral     SpO2 08/24/22 0823 97 %     Weight --      Height --      Head Circumference --      Peak Flow --      Pain Score 08/24/22 0824 10     Pain Loc --      Pain Edu? --      Excl. in [place]? --    No data found.  Updated Vital Signs BP (!) 164/111 (BP Location: Right Arm)   Pulse 71   Temp 97.9 F (36.6 C) (Oral)   Resp 16   LMP 10/21/2016   SpO2 97%   Visual Acuity Right Eye Distance:   Left Eye Distance:   Bilateral Distance:    Right Eye Near:   Left Eye Near:    Bilateral Near:     Physical Exam Vitals and nursing note reviewed.  Constitutional:      General: She is not in acute distress.    Appearance: Normal appearance. She is well-developed.  HENT:     Head: Normocephalic.     Right Ear: Tympanic membrane, ear canal and external ear normal.     Left Ear: Tympanic membrane, ear canal and external ear normal.     Mouth/Throat:     Mouth: Mucous membranes are moist.  Eyes:     General: Vision grossly intact. No visual field deficit.       Right eye: Hordeolum present. No discharge.        Left eye: No hordeolum.     Extraocular Movements: Extraocular movements intact.     Right eye: Normal extraocular motion and no nystagmus.     Conjunctiva/sclera: Conjunctivae normal.     Right eye: Right conjunctiva is not injected. No chemosis, exudate or hemorrhage.    Pupils: Pupils are equal, round, and reactive to light.     Comments: External hordeolum noted to the right lower eyelid.  There is mild peri-orbital swelling to the lower portion of the right eye.  Cardiovascular:     Rate and Rhythm: Normal rate and regular rhythm.     Heart sounds: Normal heart sounds.  Pulmonary:     Effort: Pulmonary effort is normal.     Breath sounds: Normal breath sounds.  Abdominal:  General: Bowel sounds are normal. There is no distension.     Palpations:  Abdomen is soft.     Tenderness: There is no abdominal tenderness. There is no guarding or rebound.  Genitourinary:    Vagina: Normal. No vaginal discharge.  Musculoskeletal:     Cervical back: Normal range of motion.  Skin:    General: Skin is warm and dry.     Findings: No erythema or rash.  Neurological:     Mental Status: She is alert and oriented to person, place, and time.     Cranial Nerves: No cranial nerve deficit.  Psychiatric:        Behavior: Behavior normal.      UC Treatments / Results  Labs (all labs ordered are listed, but only abnormal results are displayed) Labs Reviewed - No data to display  EKG   Radiology No results found.  Procedures Procedures (including critical care time)  Medications Ordered in UC Medications - No data to display  Initial Impression / Assessment and Plan / UC Course  I have reviewed the triage vital signs and the nursing notes.  Pertinent labs & imaging results that were available during my care of the patient were reviewed by me and considered in my medical decision making (see chart for details).  Patient presents with pain and swelling to the right lower eyelid this been present for the past 5 days.  On exam, patient has moderate tenderness to the right lower eyelid with swelling, symptoms are consistent with hordeolum.  Patient also has periorbital swelling to the right lower portion of her eye.  Will cover patient with doxycycline and erythromycin ointment.  Supportive care recommendations were provided to the patient with strict indications of when to follow-up with her eye doctor.  Patient verbalizes understanding.  All questions were answered. Final Clinical Impressions(s) / UC Diagnoses   Final diagnoses:  Hordeolum externum of right lower eyelid     Discharge Instructions      Use medication as prescribed. Continue use of warm compresses to the right eye to help with pain and swelling. May take over-the-counter  Tylenol or ibuprofen as needed for pain or discomfort. Avoid rubbing, scratching, or manipulating the eye while symptoms persist. Continue use of eyeglasses while symptoms persist. As discussed, if you develop any change in vision, sudden loss of vision, or changes in your vision, please follow-up in the emergency department or with your eye doctor immediately. Follow-up as needed.     ED Prescriptions     Medication Sig Dispense Auth. Provider   doxycycline (VIBRA-TABS) 100 MG tablet Take 1 tablet (100 mg total) by mouth 2 (two) times daily. 14 tablet Ragina Fenter-Warren, Alda Lea, NP   erythromycin ophthalmic ointment Place 1 Application into the right eye at bedtime for 7 days. 7 g Advit Trethewey-Warren, Alda Lea, NP      PDMP not reviewed this encounter.   Tish Men, NP 08/24/22 [phone]

## 2022-08-24 NOTE — Discharge Instructions (Signed)
Use medication as prescribed. Continue use of warm compresses to the right eye to help with pain and swelling. May take over-the-counter Tylenol or ibuprofen as needed for pain or discomfort. Avoid rubbing, scratching, or manipulating the eye while symptoms persist. Continue use of eyeglasses while symptoms persist. As discussed, if you develop any change in vision, sudden loss of vision, or changes in your vision, please follow-up in the emergency department or with your eye doctor immediately. Follow-up as needed.

## 2022-09-18 ENCOUNTER — Telehealth: Payer: Self-pay | Admitting: Family

## 2022-09-18 ENCOUNTER — Encounter: Payer: Self-pay | Admitting: Family

## 2022-09-18 ENCOUNTER — Ambulatory Visit (INDEPENDENT_AMBULATORY_CARE_PROVIDER_SITE_OTHER): Payer: Commercial Managed Care - PPO | Admitting: Family

## 2022-09-18 DIAGNOSIS — M76822 Posterior tibial tendinitis, left leg: Secondary | ICD-10-CM | POA: Diagnosis not present

## 2022-09-18 DIAGNOSIS — M7671 Peroneal tendinitis, right leg: Secondary | ICD-10-CM | POA: Diagnosis not present

## 2022-09-18 NOTE — Telephone Encounter (Signed)
Pt had an appt this morning and forgot to ask for a renewal handicap placard. Please call when ready for pick up. Pt phone number is [phone].

## 2022-09-18 NOTE — Telephone Encounter (Signed)
Filled out holding for MD signature.

## 2022-09-18 NOTE — Progress Notes (Signed)
Office Visit Note   Patient: Felicia Daniel           Date of Birth: 03-10-1961           MRN: [id number] Visit Date: 09/18/2022              Requested by: Celene Squibb, MD [place],  [place] [address] PCP: Celene Squibb, MD  No chief complaint on file.     HPI: The patient is a 61 year old woman who is seen in follow-up she is status post left talonavicular and subtalar fusion she has been using Voltaren gel over her peroneal tendons she continues to have significant pain with weightbearing she returned to work full-time about 6 weeks ago unfortunately she was unable to complete her work duties due to pain with weightbearing and has been let go from her job  Has tried creating braces she states she is more comfortable when she wears tight lace up ankle boots or has even attempted using rollers on either side of her ankle and duct taping these to her leg  Assessment & Plan: Visit Diagnoses: No diagnosis found.  Plan: I have offered her an order for a double upright brace for the left lower extremity.  Also provided her a thin heel lift.  She will continue with her Rolena Infante walking shoes activities as tolerated  Follow-Up Instructions: No follow-ups on file.   Ortho Exam  Patient is alert, oriented, no adenopathy, well-dressed, normal affect, normal respiratory effort. On examination of the left foot her foot is plantigrade the incisions are well-healed.  There is minimal edema.  Does have a palpable dorsalis pedis pulse.  Imaging: No results found. No images are attached to the encounter.  Labs: Lab Results  Component Value Date   REPTSTATUS 07/27/2019 FINAL 07/25/2019   CULT (A) 07/25/2019    <10,000 COLONIES/mL INSIGNIFICANT GROWTH Performed at [facility] [address], [place], [place] [address]      Lab Results  Component Value Date   ALBUMIN 4.0 07/25/2019    No results found for: "MG" No results found for: "VD25OH"  No results found  for: "PREALBUMIN"    Latest Ref Rng & Units 03/01/2022    5:53 AM 07/25/2019   10:10 PM  CBC EXTENDED  WBC 4.0 - 10.5 K/uL 6.1  12.8   RBC 3.87 - 5.11 MIL/uL 4.74  5.00   Hemoglobin 12.0 - 15.0 g/dL 13.9  13.9   HCT 36.0 - 46.0 % 40.9  43.0   Platelets 150 - 400 K/uL 190  191   NEUT# 1.7 - 7.7 K/uL  10.5   Lymph# 0.7 - 4.0 K/uL  1.5      There is no height or weight on file to calculate BMI.  Orders:  No orders of the defined types were placed in this encounter.  No orders of the defined types were placed in this encounter.    Procedures: No procedures performed  Clinical Data: No additional findings.  ROS:  All other systems negative, except as noted in the HPI. Review of Systems  Objective: Vital Signs: LMP 10/21/2016   Specialty Comments:  No specialty comments available.  PMFS History: Patient Active Problem List   Diagnosis Date Noted   Encounter for screening fecal occult blood testing 07/23/2022   Encounter for gynecological examination with Papanicolaou smear of cervix 07/23/2022   Posterior tibial tendon dysfunction (PTTD) of left lower extremity    Dysuria 08/13/2021  Eczema 07/12/2021   Herpes simplex 07/12/2021   Hypocalcemia 07/12/2021   Overweight 07/12/2021   Mixed hyperlipidemia 07/07/2021   PMB (postmenopausal bleeding) 06/18/2021   Papanicolaou smear of cervix with positive high risk human papilloma virus (HPV) test 06/18/2021   Generalized anxiety disorder 06/11/2021   Past Medical History:  Diagnosis Date   Anemia    iron deficency in young adult life.   Anxiety    Arthritis    Depression    Herpes    Papanicolaou smear of cervix with positive high risk human papilloma virus (HPV) test 06/18/2021   Repeat pap in 1 year     Family History  Problem Relation Age of Onset   Skin cancer Father    Breast cancer Mother    Peripheral vascular disease Mother     Past Surgical History:  Procedure Laterality Date   CHOLECYSTECTOMY      COLONOSCOPY WITH PROPOFOL N/A 11/10/2020   Procedure: COLONOSCOPY WITH PROPOFOL;  Surgeon: Eloise Harman, DO;  [facility];  Service: Endoscopy;  Laterality: N/A;  7:30   FOOT ARTHRODESIS Left 03/01/2022   Procedure: LEFT TALONAVICULAR AND SUBTALAR FUSION;  Surgeon: Newt Minion, MD;  Location: [facility];  Service: Orthopedics;  Laterality: Left;   LEG SURGERY     POLYPECTOMY  11/10/2020   Procedure: POLYPECTOMY;  Surgeon: Eloise Harman, DO;  [facility];  Service: Endoscopy;;   Social History   Occupational History   Not on file  Tobacco Use   Smoking status: Never   Smokeless tobacco: Never  Vaping Use   Vaping Use: Never used  Substance and Sexual Activity   Alcohol use: Yes    Comment: occ   Drug use: Never   Sexual activity: Yes    Birth control/protection: Post-menopausal

## 2022-09-19 NOTE — Telephone Encounter (Signed)
Called pt to advise that this is ready for pick up.

## 2022-11-11 ENCOUNTER — Inpatient Hospital Stay (HOSPITAL_COMMUNITY): Admission: RE | Admit: 2022-11-11 | Payer: Commercial Managed Care - PPO | Source: Ambulatory Visit

## 2022-11-11 ENCOUNTER — Other Ambulatory Visit (HOSPITAL_COMMUNITY): Payer: Self-pay | Admitting: Internal Medicine

## 2022-11-11 DIAGNOSIS — Z1231 Encounter for screening mammogram for malignant neoplasm of breast: Secondary | ICD-10-CM

## 2022-12-25 ENCOUNTER — Ambulatory Visit (HOSPITAL_COMMUNITY)
Admission: RE | Admit: 2022-12-25 | Discharge: 2022-12-25 | Disposition: A | Discharge status: Home / Self Care | Payer: Commercial Managed Care - PPO | Source: Ambulatory Visit | Attending: Internal Medicine | Admitting: Internal Medicine

## 2022-12-25 ENCOUNTER — Inpatient Hospital Stay (HOSPITAL_COMMUNITY): Admission: RE | Admit: 2022-12-25 | Payer: Commercial Managed Care - PPO | Source: Ambulatory Visit

## 2022-12-25 DIAGNOSIS — Z1231 Encounter for screening mammogram for malignant neoplasm of breast: Secondary | ICD-10-CM | POA: Diagnosis present

## 2023-02-20 ENCOUNTER — Encounter: Payer: Self-pay | Admitting: Radiology

## 2023-02-21 HISTORY — PX: ANKLE FUSION: SHX881

## 2023-04-01 ENCOUNTER — Other Ambulatory Visit (INDEPENDENT_AMBULATORY_CARE_PROVIDER_SITE_OTHER): Payer: Commercial Managed Care - PPO

## 2023-04-01 ENCOUNTER — Ambulatory Visit (INDEPENDENT_AMBULATORY_CARE_PROVIDER_SITE_OTHER): Payer: Commercial Managed Care - PPO | Admitting: Family

## 2023-04-01 DIAGNOSIS — M76822 Posterior tibial tendinitis, left leg: Secondary | ICD-10-CM

## 2023-04-01 DIAGNOSIS — M79672 Pain in left foot: Secondary | ICD-10-CM | POA: Diagnosis not present

## 2023-04-01 MED ORDER — PREDNISONE 50 MG PO TABS
ORAL_TABLET | ORAL | 0 refills | Status: DC
Start: 2023-04-01 — End: 2023-09-01

## 2023-04-01 NOTE — Progress Notes (Unsigned)
Office Visit Note   Patient: Felicia Daniel           Date of Birth: 10/20/1961           MRN: [id number] Visit Date: 04/01/2023              Requested by: Benita Stabile, MD [address] [place],  [place] [address] PCP: Benita Stabile, MD  Chief Complaint  Patient presents with   Left Ankle - Pain      HPI: The patient is a 62 year old woman who presents today for 4 ongoing issues with her left foot and ankle.  She does have posterior tibial tendon dysfunction has been placed in a double upright brace.  She does have a thin heel lift in this as well.  She states since beginning wearing her brace she has been having some blistering from rubbing of her brace she does have an appointment later today for evaluation of her double upright brace possible modifications.  She is status post subtalar talonavicular fusion March 10 of last year.  She has Brooks walking shoes which she wears daily.  She is attempted to return for work but was unable to even tolerate reducing her hours to 8 hours a day.  Complaining of excruciating severe pain to the left medial ankle this extends over the dorsum of her foot she has some associated anterior knee pain with this this occurs especially at night when she is at rest.  Wakes her from sleep  Some mild numbness in the foot associated.  Denies back pain or injury no red flag symptoms  Assessment & Plan: Visit Diagnoses:  1. Posterior tibial tendon dysfunction (PTTD) of left lower extremity   2. Left foot pain     Plan: Posterior tibial tendon dysfunction.  History of ankle and foot fusion.  She seems to be having some radicular symptoms on the left as well we will place her on her prednisone burst and follow-up with her by phone in 1 week  Follow-Up Instructions: No follow-ups on file.   Left Ankle Exam   Comments:  Some tenderness in the medial ankle course of posterior tibial tendon,   Back Exam   Tenderness  The patient is experiencing no  tenderness.   Muscle Strength  The patient has normal back strength.  Tests  Straight leg raise left: positive      Patient is alert, oriented, no adenopathy, well-dressed, normal affect, normal respiratory effort.   Imaging: No results found. No images are attached to the encounter.  Labs: Lab Results  Component Value Date   REPTSTATUS 07/27/2019 FINAL 07/25/2019   CULT (A) 07/25/2019    <10,000 COLONIES/mL INSIGNIFICANT GROWTH Performed at [facility], [address] [place], [place] [address]      Lab Results  Component Value Date   ALBUMIN 4.0 07/25/2019    No results found for: "MG" No results found for: "VD25OH"  No results found for: "PREALBUMIN"    Latest Ref Rng & Units 03/01/2022    5:53 AM 07/25/2019   10:10 PM  CBC EXTENDED  WBC 4.0 - 10.5 K/uL 6.1  12.8   RBC 3.87 - 5.11 MIL/uL 4.74  5.00   Hemoglobin 12.0 - 15.0 g/dL 25.4  27.0   HCT 62.3 - 46.0 % 40.9  43.0   Platelets 150 - 400 K/uL 190  191   NEUT# 1.7 - 7.7 K/uL  10.5   Lymph# 0.7 - 4.0  K/uL  1.5      There is no height or weight on file to calculate BMI.  Orders:  Orders Placed This Encounter  Procedures   XR Ankle Complete Left   XR Lumbar Spine 2-3 Views   No orders of the defined types were placed in this encounter.    Procedures: No procedures performed  Clinical Data: No additional findings.  ROS:  All other systems negative, except as noted in the HPI. Review of Systems  Objective: Vital Signs: LMP 10/21/2016   Specialty Comments:  No specialty comments available.  PMFS History: Patient Active Problem List   Diagnosis Date Noted   Encounter for screening fecal occult blood testing 07/23/2022   Encounter for gynecological examination with Papanicolaou smear of cervix 07/23/2022   Posterior tibial tendon dysfunction (PTTD) of left lower extremity    Dysuria 08/13/2021   Eczema 07/12/2021   Herpes simplex 07/12/2021   Hypocalcemia 07/12/2021    Overweight 07/12/2021   Mixed hyperlipidemia 07/07/2021   PMB (postmenopausal bleeding) 06/18/2021   Papanicolaou smear of cervix with positive high risk human papilloma virus (HPV) test 06/18/2021   Generalized anxiety disorder 06/11/2021   Past Medical History:  Diagnosis Date   Anemia    iron deficency in young adult life.   Anxiety    Arthritis    Depression    Herpes    Papanicolaou smear of cervix with positive high risk human papilloma virus (HPV) test 06/18/2021   Repeat pap in 1 year     Family History  Problem Relation Age of Onset   Skin cancer Father    Breast cancer Mother    Peripheral vascular disease Mother     Past Surgical History:  Procedure Laterality Date   CHOLECYSTECTOMY     COLONOSCOPY WITH PROPOFOL N/A 11/10/2020   Procedure: COLONOSCOPY WITH PROPOFOL;  Surgeon: Lanelle Bal, DO;  [facility];  Service: Endoscopy;  Laterality: N/A;  7:30   FOOT ARTHRODESIS Left 03/01/2022   Procedure: LEFT TALONAVICULAR AND SUBTALAR FUSION;  Surgeon: Nadara Mustard, MD;  Location: [facility];  Service: Orthopedics;  Laterality: Left;   LEG SURGERY     POLYPECTOMY  11/10/2020   Procedure: POLYPECTOMY;  Surgeon: Lanelle Bal, DO;  [facility];  Service: Endoscopy;;   Social History   Occupational History   Not on file  Tobacco Use   Smoking status: Never   Smokeless tobacco: Never  Vaping Use   Vaping Use: Never used  Substance and Sexual Activity   Alcohol use: Yes    Comment: occ   Drug use: Never   Sexual activity: Yes    Birth control/protection: Post-menopausal

## 2023-04-02 ENCOUNTER — Encounter: Payer: Self-pay | Admitting: Family

## 2023-07-29 ENCOUNTER — Ambulatory Visit: Payer: Commercial Managed Care - PPO | Admitting: Adult Health

## 2023-09-01 ENCOUNTER — Encounter: Payer: Self-pay | Admitting: Adult Health

## 2023-09-01 ENCOUNTER — Ambulatory Visit (INDEPENDENT_AMBULATORY_CARE_PROVIDER_SITE_OTHER): Payer: Commercial Managed Care - PPO | Admitting: Adult Health

## 2023-09-01 VITALS — BP 138/80 | HR 68 | Ht 64.0 in | Wt 174.5 lb

## 2023-09-01 DIAGNOSIS — Z1211 Encounter for screening for malignant neoplasm of colon: Secondary | ICD-10-CM

## 2023-09-01 DIAGNOSIS — Z01419 Encounter for gynecological examination (general) (routine) without abnormal findings: Secondary | ICD-10-CM

## 2023-09-01 LAB — HEMOCCULT GUIAC POC 1CARD (OFFICE): Fecal Occult Blood, POC: NEGATIVE

## 2023-09-01 NOTE — Progress Notes (Signed)
Patient ID: Felicia Daniel, female   DOB: [birth year], 62 y.o.   MRN: [id number] History of Present Illness: Felicia Daniel is a 62 year old white female,married, PM in for a well woman gyn exam, her ankle is healed and she is back at work.     Component Value Date/Time   DIAGPAP  07/23/2022 0922    - Negative for intraepithelial lesion or malignancy (NILM)   HPVHIGH Negative 07/23/2022 0922   ADEQPAP  07/23/2022 0922    Satisfactory for evaluation; transformation zone component PRESENT.   PCP is Dr Margo Aye.   Current Medications, Allergies, Past Medical History, Past Surgical History, Family History and Social History were reviewed in [employer] record.     Review of Systems: Patient denies any headaches, hearing loss, fatigue, blurred vision, shortness of breath, chest pain, abdominal pain, problems with bowel movements, urination, or intercourse.(Not active). No joint pain or mood swings.  Denies any vaginal bleeding   Physical Exam:BP 138/80 (BP Location: Left Arm, Patient Position: Sitting, Cuff Size: Normal)   Pulse 68   Ht 5\' 4"  (1.626 m)   Wt 174 lb 8 oz (79.2 kg)   LMP 10/21/2016   BMI 29.95 kg/m   General:  Well developed, well nourished, no acute distress Skin:  Warm and dry Neck:  Midline trachea, normal thyroid, good ROM, no lymphadenopathy,no carotid bruits heard Lungs; Clear to auscultation bilaterally Breast:  No dominant palpable mass, retraction, or nipple discharge Cardiovascular: Regular rate and rhythm Abdomen:  Soft, non tender, no hepatosplenomegaly Pelvic:  External genitalia is normal in appearance, no lesions.  The vagina is normal in pale. Urethra has no lesions or masses. The cervix is smooth.  Uterus is felt to be normal size, shape, and contour.  No adnexal masses or tenderness noted.Bladder is non tender, no masses felt. Rectal: Good sphincter tone, no polyps, or hemorrhoids felt.  Hemoccult negative.+rectocele Extremities/musculoskeletal:   No swelling or varicosities noted, no clubbing or cyanosis Psych:  No mood changes, alert and cooperative,seems happy AA is o Fall risk is low    09/01/2023   10:41 AM 07/23/2022    9:20 AM 06/18/2021    8:56 AM  Depression screen PHQ 2/9  Decreased Interest 0 0 2  Down, Depressed, Hopeless 0 0 3  PHQ - 2 Score 0 0 5  Altered sleeping 0 0 1  Tired, decreased energy 0 0 1  Change in appetite 0 0 1  Feeling bad or failure about yourself  0 0 0  Trouble concentrating 0 1 0  Moving slowly or fidgety/restless 0 0 0  Suicidal thoughts 0 0 0  PHQ-9 Score 0 1 8       09/01/2023   10:42 AM 07/23/2022    9:20 AM 06/18/2021    8:56 AM  GAD 7 : Generalized Anxiety Score  Nervous, Anxious, on Edge 0 1 1  Control/stop worrying 0 0 0  Worry too much - different things 0 0 1  Trouble relaxing 0 0 1  Restless 0 1 0  Easily annoyed or irritable 0 1 1  Afraid - awful might happen 0 0 0  Total GAD 7 Score 0 3 4    Upstream - 09/01/23 1050       Pregnancy Intention Screening   Does the patient want to become pregnant in the next year? N/A    Does the patient's partner want to become pregnant in the next year? N/A    Would the patient  like to discuss contraceptive options today? N/A      Contraception Wrap Up   Current Method Abstinence   PM   End Method Abstinence   PM   Contraception Counseling Provided No              Examination chaperoned by Malachy Mood LPN   Impression and Plan: 1. Encounter for well woman exam with routine gynecological exam Physical in 1 year Labs with PCP Pap in 2026 Mammogram was negative 12/25/22 Colonoscopy in November she says  Stay active  2. Encounter for screening fecal occult blood testing Hemoccult was negative  - POCT occult blood stool

## 2023-11-07 ENCOUNTER — Encounter: Payer: Self-pay | Admitting: *Deleted

## 2023-11-12 ENCOUNTER — Encounter: Payer: Self-pay | Admitting: *Deleted

## 2023-11-17 ENCOUNTER — Other Ambulatory Visit (HOSPITAL_COMMUNITY): Payer: Self-pay | Admitting: Internal Medicine

## 2023-11-17 DIAGNOSIS — Z1231 Encounter for screening mammogram for malignant neoplasm of breast: Secondary | ICD-10-CM

## 2023-12-29 ENCOUNTER — Ambulatory Visit (HOSPITAL_COMMUNITY)
Admission: RE | Admit: 2023-12-29 | Discharge: 2023-12-29 | Disposition: A | Discharge status: Home / Self Care | Payer: Commercial Managed Care - PPO | Source: Ambulatory Visit | Attending: Internal Medicine | Admitting: Internal Medicine

## 2023-12-29 DIAGNOSIS — Z1231 Encounter for screening mammogram for malignant neoplasm of breast: Secondary | ICD-10-CM | POA: Diagnosis present

## 2023-12-30 ENCOUNTER — Telehealth: Payer: Self-pay | Admitting: *Deleted

## 2023-12-30 NOTE — Telephone Encounter (Signed)
  Procedure: Colonoscopy  Height: 5'4 Weight: 170lbs       Have you had a colonoscopy before?  10/2020 Dr Cindie  Do you have family history of colon cancer?  yes  Do you have a family history of polyps? yes  Previous colonoscopy with polyps removed? yes  Do you have a history colorectal cancer?   no  Are you diabetic?  no  Do you have a prosthetic or mechanical heart valve? no  Do you have a pacemaker/defibrillator?   no  Have you had endocarditis/atrial fibrillation?  no  Do you use supplemental oxygen/CPAP?  no  Have you had joint replacement within the last 12 months?  no  Do you tend to be constipated or have to use laxatives?  no   Do you have history of alcohol use? If yes, how much and how often.  no  Do you have history or are you using drugs? If yes, what do are you  using?  no  Have you ever had a stroke/heart attack?  no  Have you ever had a heart or other vascular stent placed,?no  Do you take weight loss medication? no  female patients,: have you had a hysterectomy? no                              are you post menopausal?  no                              do you still have your menstrual cycle? no    Date of last menstrual period? 2019  Do you take any blood-thinning medications such as: (Plavix, aspirin, Coumadin, Aggrenox, Brilinta, Xarelto, Eliquis, Pradaxa, Savaysa or Effient)? no  If yes we need the name, milligram, dosage and who is prescribing doctor:               Current Outpatient Medications  Medication Sig Dispense Refill   Omega 3 1000 MG CAPS Take by mouth daily.     Vitamin D-Vitamin K (VITAMIN K2-VITAMIN D3 PO) Take by mouth daily.     No current facility-administered medications for this visit.    Allergies  Allergen Reactions   Dilaudid [Hydromorphone] Itching and Rash    skin coming off

## 2024-01-21 NOTE — Telephone Encounter (Signed)
2021: three 4-6 mm polyps in transverse, one 12 mm polyp in sigmoid. Tubular adenomas  ASA 2

## 2024-01-22 ENCOUNTER — Encounter (INDEPENDENT_AMBULATORY_CARE_PROVIDER_SITE_OTHER): Payer: Self-pay | Admitting: *Deleted

## 2024-01-22 MED ORDER — NA SULFATE-K SULFATE-MG SULF 17.5-3.13-1.6 GM/177ML PO SOLN: 1.0000 | ORAL | 0 refills | Status: DC

## 2024-01-22 NOTE — Telephone Encounter (Signed)
Spoke with pt. She has been scheduled for 2/25. Aware will send instructions and rx for pre prep to pharmacy

## 2024-01-22 NOTE — Telephone Encounter (Signed)
PA submitted via [facility] Case ID# [id number]

## 2024-01-22 NOTE — Telephone Encounter (Signed)
Referral completed, [facility] apt letter sent to PCP

## 2024-01-22 NOTE — Addendum Note (Signed)
Addended by: Armstead Peaks on: 01/22/2024 09:32 AM   Modules accepted: Orders

## 2024-01-26 NOTE — Telephone Encounter (Signed)
Pt called in and needed to reschedule procedure for 2/25. She ahs been moved to 3/10. Aware will send new instructions.

## 2024-02-26 ENCOUNTER — Encounter (HOSPITAL_COMMUNITY)
Admission: RE | Admit: 2024-02-26 | Discharge: 2024-02-26 | Disposition: A | Discharge status: Home / Self Care | Payer: Commercial Managed Care - PPO | Source: Ambulatory Visit | Attending: Internal Medicine | Admitting: Internal Medicine

## 2024-03-01 ENCOUNTER — Other Ambulatory Visit: Payer: Self-pay

## 2024-03-01 ENCOUNTER — Encounter (HOSPITAL_COMMUNITY)
Admission: RE | Disposition: A | Discharge status: Home / Self Care | Payer: Self-pay | Source: Home / Self Care | Attending: Internal Medicine

## 2024-03-01 ENCOUNTER — Ambulatory Visit (HOSPITAL_COMMUNITY): Admitting: Anesthesiology

## 2024-03-01 ENCOUNTER — Encounter (HOSPITAL_COMMUNITY): Payer: Self-pay | Admitting: Internal Medicine

## 2024-03-01 ENCOUNTER — Ambulatory Visit (HOSPITAL_COMMUNITY)
Admission: RE | Admit: 2024-03-01 | Discharge: 2024-03-01 | Disposition: A | Discharge status: Home / Self Care | Payer: Commercial Managed Care - PPO | Attending: Internal Medicine | Admitting: Internal Medicine

## 2024-03-01 DIAGNOSIS — K635 Polyp of colon: Secondary | ICD-10-CM

## 2024-03-01 DIAGNOSIS — D123 Benign neoplasm of transverse colon: Secondary | ICD-10-CM | POA: Diagnosis not present

## 2024-03-01 DIAGNOSIS — K648 Other hemorrhoids: Secondary | ICD-10-CM

## 2024-03-01 DIAGNOSIS — K5730 Diverticulosis of large intestine without perforation or abscess without bleeding: Secondary | ICD-10-CM

## 2024-03-01 DIAGNOSIS — Z1211 Encounter for screening for malignant neoplasm of colon: Secondary | ICD-10-CM

## 2024-03-01 DIAGNOSIS — Z09 Encounter for follow-up examination after completed treatment for conditions other than malignant neoplasm: Secondary | ICD-10-CM | POA: Diagnosis present

## 2024-03-01 HISTORY — PX: POLYPECTOMY: SHX5525

## 2024-03-01 HISTORY — PX: COLONOSCOPY WITH PROPOFOL: SHX5780

## 2024-03-01 SURGERY — COLONOSCOPY WITH PROPOFOL
Anesthesia: General

## 2024-03-01 MED ORDER — LACTATED RINGERS IV SOLN: INTRAVENOUS | Status: DC

## 2024-03-01 MED ORDER — PROPOFOL 500 MG/50ML IV EMUL
INTRAVENOUS | Status: DC | PRN
  Administered 2024-03-01: 100 ug/kg/min via INTRAVENOUS

## 2024-03-01 MED ORDER — STERILE WATER FOR IRRIGATION IR SOLN
Status: DC | PRN
  Administered 2024-03-01: 120 mL

## 2024-03-01 NOTE — Discharge Instructions (Signed)
   Colonoscopy Discharge Instructions  Read the instructions outlined below and refer to this sheet in the next few weeks. These discharge instructions provide you with general information on caring for yourself after you leave the hospital. Your doctor may also give you specific instructions. While your treatment has been planned according to the most current medical practices available, unavoidable complications occasionally occur.   ACTIVITY You may resume your regular activity, but move at a slower pace for the next 24 hours.  Take frequent rest periods for the next 24 hours.  Walking will help get rid of the air and reduce the bloated feeling in your belly (abdomen).  No driving for 24 hours (because of the medicine (anesthesia) used during the test).   Do not sign any important legal documents or operate any machinery for 24 hours (because of the anesthesia used during the test).  NUTRITION Drink plenty of fluids.  You may resume your normal diet as instructed by your doctor.  Begin with a light meal and progress to your normal diet. Heavy or fried foods are harder to digest and may make you feel sick to your stomach (nauseated).  Avoid alcoholic beverages for 24 hours or as instructed.  MEDICATIONS You may resume your normal medications unless your doctor tells you otherwise.  WHAT YOU CAN EXPECT TODAY Some feelings of bloating in the abdomen.  Passage of more gas than usual.  Spotting of blood in your stool or on the toilet paper.  IF YOU HAD POLYPS REMOVED DURING THE COLONOSCOPY: No aspirin products for 7 days or as instructed.  No alcohol for 7 days or as instructed.  Eat a soft diet for the next 24 hours.  FINDING OUT THE RESULTS OF YOUR TEST Not all test results are available during your visit. If your test results are not back during the visit, make an appointment with your caregiver to find out the results. Do not assume everything is normal if you have not heard from your  caregiver or the medical facility. It is important for you to follow up on all of your test results.  SEEK IMMEDIATE MEDICAL ATTENTION IF: You have more than a spotting of blood in your stool.  Your belly is swollen (abdominal distention).  You are nauseated or vomiting.  You have a temperature over 101.  You have abdominal pain or discomfort that is severe or gets worse throughout the day.   Your colonoscopy revealed 1 polyp(s) which I removed successfully. Await pathology results, my office will contact you. I recommend repeating colonoscopy in 5 years for surveillance purposes.  You also have diverticulosis and internal hemorrhoids. I would recommend increasing fiber in your diet or adding OTC Benefiber/Metamucil. Be sure to drink at least 4 to 6 glasses of water daily. Follow-up with GI as needed.   I hope you have a great rest of your week!  Hennie Duos. Marletta Lor, D.O. Gastroenterology and Hepatology [facility]

## 2024-03-01 NOTE — Op Note (Addendum)
 [facility] Patient Name: Felicia Daniel Procedure Date: 03/01/2024 8:14 AM MRN: [id number] Date of Birth: 01-05-1961 Attending MD: Hennie Duos. Marletta Lor , [place], [id number] CSN: [id number] Age: 63 Admit Type: Outpatient Procedure:                Colonoscopy Indications:              Surveillance: Personal history of adenomatous                            polyps on last colonoscopy > 3 years ago Providers:                Hennie Duos. Marletta Lor, DO, Sheran Fava, Dyann Ruddle Referring MD:              Medicines:                See the Anesthesia note for documentation of the                            administered medications Complications:            No immediate complications. Estimated Blood Loss:     Estimated blood loss was minimal. Procedure:                Pre-Anesthesia Assessment:                           - The anesthesia plan was to use monitored                            anesthesia care (MAC).                           After obtaining informed consent, the colonoscope                            was passed under direct vision. Throughout the                            procedure, the patient's blood pressure, pulse, and                            oxygen saturations were monitored continuously. The                            [id number]-HQ190L ([id number]) scope was introduced through                            the anus and advanced to the the cecum, identified                            by appendiceal orifice and ileocecal valve. The                            colonoscopy was performed without difficulty.  The                            patient tolerated the procedure well. The quality                            of the bowel preparation was evaluated using the                            BBPS [facility] Bowel Preparation Scale) with scores                            of: Right Colon = 3, Transverse Colon = 3 and Left                            Colon = 3 (entire mucosa  seen well with no residual                            staining, small fragments of stool or opaque                            liquid). The total BBPS score equals 9. Scope In: 8:38:28 AM Scope Out: 8:53:34 AM Scope Withdrawal Time: 0 hours 10 minutes 43 seconds  Total Procedure Duration: 0 hours 15 minutes 6 seconds  Findings:      Non-bleeding internal hemorrhoids were found during endoscopy.      Multiple medium-mouthed and small-mouthed diverticula were found in the       sigmoid colon.      A 4 mm polyp was found in the transverse colon. The polyp was sessile.       The polyp was removed with a cold snare. Resection and retrieval were       complete.      The exam was otherwise without abnormality on direct and retroflexion       views. Impression:               - Non-bleeding internal hemorrhoids.                           - Diverticulosis in the sigmoid colon.                           - One 4 mm polyp in the transverse colon, removed                            with a cold snare. Resected and retrieved.                           - The examination was otherwise normal on direct                            and retroflexion views. Moderate Sedation:      Per Anesthesia Care Recommendation:           - Patient has a contact number available for  emergencies. The signs and symptoms of potential                            delayed complications were discussed with the                            patient. Return to normal activities tomorrow.                            Written discharge instructions were provided to the                            patient.                           - Resume previous diet.                           - Continue present medications.                           - Await pathology results.                           - Repeat colonoscopy in 5 years for surveillance                            and history of polyps prior.                            - Return to GI clinic PRN. Procedure Code(s):        --- Professional ---                           [phone], Colonoscopy, flexible; with removal of                            tumor(s), polyp(s), or other lesion(s) by snare                            technique Diagnosis Code(s):        --- Professional ---                           Z86.010, Personal history of colonic polyps                           K64.8, Other hemorrhoids                           D12.3, Benign neoplasm of transverse colon (hepatic                            flexure or splenic flexure)                           K57.30, Diverticulosis of large intestine without  perforation or abscess without bleeding CPT copyright 2022 American Medical Association. All rights reserved. The codes documented in this report are preliminary and upon coder review may  be revised to meet current compliance requirements. Hennie Duos. Marletta Lor, DO Hennie Duos. Adriyana Greenbaum, DO 03/01/2024 8:59:10 AM This report has been signed electronically. Number of Addenda: 0

## 2024-03-01 NOTE — Anesthesia Postprocedure Evaluation (Signed)
 Anesthesia Post Note  Patient: Felicia Daniel  Procedure(s) Performed: COLONOSCOPY WITH PROPOFOL POLYPECTOMY  Patient location during evaluation: Phase II Anesthesia Type: General Level of consciousness: awake Pain management: pain level controlled Vital Signs Assessment: post-procedure vital signs reviewed and stable Respiratory status: spontaneous breathing and respiratory function stable Cardiovascular status: blood pressure returned to baseline and stable Postop Assessment: no headache and no apparent nausea or vomiting Anesthetic complications: no Comments: Late entry   No notable events documented.   Last Vitals:  Vitals:   03/01/24 0855 03/01/24 0901  BP: (!) 88/66 99/78  Pulse: (!) 59   Resp: 17   Temp: 36.4 C   SpO2: 98%     Last Pain:  Vitals:   03/01/24 0855  TempSrc: Oral  PainSc: Asleep                 Windell Norfolk

## 2024-03-01 NOTE — Transfer of Care (Signed)
 Immediate Anesthesia Transfer of Care Note  Patient: Felicia Daniel  Procedure(s) Performed: COLONOSCOPY WITH PROPOFOL POLYPECTOMY  Patient Location: Short Stay  Anesthesia Type:General  Level of Consciousness: drowsy  Airway & Oxygen Therapy: Patient Spontanous Breathing  Post-op Assessment: Report given to RN and Post -op Vital signs reviewed and stable  Post vital signs: Reviewed and stable  Last Vitals:  Vitals Value Taken Time  BP 88/66 03/01/24 0855  Temp 36.4 C 03/01/24 0855  Pulse 59 03/01/24 0855  Resp 17 03/01/24 0855  SpO2 98 % 03/01/24 0855    Last Pain:  Vitals:   03/01/24 0855  TempSrc: Oral  PainSc: Asleep         Complications: No notable events documented.

## 2024-03-01 NOTE — Anesthesia Preprocedure Evaluation (Signed)
 Anesthesia Evaluation  Patient identified by MRN, date of birth, ID band Patient awake    Reviewed: Allergy & Precautions, H&P , NPO status , Patient's Chart, lab work & pertinent test results, reviewed documented beta blocker date and time   Airway Mallampati: II  TM Distance: >3 FB Neck ROM: full    Dental no notable dental hx.    Pulmonary neg pulmonary ROS   Pulmonary exam normal breath sounds clear to auscultation       Cardiovascular Exercise Tolerance: Good hypertension, negative cardio ROS  Rhythm:regular Rate:Normal     Neuro/Psych  PSYCHIATRIC DISORDERS Anxiety Depression    negative neurological ROS     GI/Hepatic negative GI ROS, Neg liver ROS,,,  Endo/Other  negative endocrine ROS    Renal/GU negative Renal ROS  negative genitourinary   Musculoskeletal   Abdominal   Peds  Hematology  (+) Blood dyscrasia, anemia   Anesthesia Other Findings   Reproductive/Obstetrics negative OB ROS                             Anesthesia Physical Anesthesia Plan  ASA: 2  Anesthesia Plan: General   Post-op Pain Management:    Induction:   PONV Risk Score and Plan: Propofol infusion  Airway Management Planned:   Additional Equipment:   Intra-op Plan:   Post-operative Plan:   Informed Consent: I have reviewed the patients History and Physical, chart, labs and discussed the procedure including the risks, benefits and alternatives for the proposed anesthesia with the patient or authorized representative who has indicated his/her understanding and acceptance.     Dental Advisory Given  Plan Discussed with: CRNA  Anesthesia Plan Comments:        Anesthesia Quick Evaluation

## 2024-03-01 NOTE — H&P (Signed)
 Primary Care Physician:  Benita Stabile, MD Primary Gastroenterologist:  Dr. Marletta Lor  Pre-Procedure History & Physical: HPI:  Felicia Daniel is a 63 y.o. female is here for a colonoscopy to be performed for surveillance purposes, personal history of adenomatous colon polyps in 2021  Past Medical History:  Diagnosis Date   Anemia    iron deficency in young adult life.   Anxiety    Arthritis    Depression    Herpes    Papanicolaou smear of cervix with positive high risk human papilloma virus (HPV) test 06/18/2021   Repeat pap in 1 year     Past Surgical History:  Procedure Laterality Date   ANKLE FUSION Left 02/2023   CHOLECYSTECTOMY     COLONOSCOPY WITH PROPOFOL N/A 11/10/2020   Procedure: COLONOSCOPY WITH PROPOFOL;  Surgeon: Lanelle Bal, DO;  Location: [facility];  Service: Endoscopy;  Laterality: N/A;  7:30   FOOT ARTHRODESIS Left 03/01/2022   Procedure: LEFT TALONAVICULAR AND SUBTALAR FUSION;  Surgeon: Nadara Mustard, MD;  Location: [facility] OR;  Service: Orthopedics;  Laterality: Left;   LEG SURGERY     POLYPECTOMY  11/10/2020   Procedure: POLYPECTOMY;  Surgeon: Lanelle Bal, DO;  Location: [facility];  Service: Endoscopy;;    Prior to Admission medications   Medication Sig Start Date End Date Taking? Authorizing Provider  Na Sulfate-K Sulfate-Mg Sulfate concentrate 17.5-3.13-1.6 GM/177ML SOLN Take 1 kit by mouth as directed. 01/22/24  Yes Icela Glymph K, DO  Omega 3 1000 MG CAPS Take by mouth daily.   Yes [provider]  Vitamin D-Vitamin K (VITAMIN K2-VITAMIN D3 PO) Take by mouth daily.   Yes [provider]    Allergies as of 01/22/2024 - Review Complete 12/30/2023  Allergen Reaction Noted   Dilaudid [hydromorphone] Itching and Rash 07/25/2019    Family History  Problem Relation Age of Onset   Skin cancer Father    Breast cancer Mother    Peripheral vascular disease Mother    Drug abuse Daughter    Other Son        MVA    Social  History   Socioeconomic History   Marital status: Married    Spouse name: Not on file   Number of children: Not on file   Years of education: Not on file   Highest education level: Not on file  Occupational History   Not on file  Tobacco Use   Smoking status: Never   Smokeless tobacco: Never  Vaping Use   Vaping status: Never Used  Substance and Sexual Activity   Alcohol use: Not Currently   Drug use: Never   Sexual activity: Not Currently    Birth control/protection: Post-menopausal, Abstinence  Other Topics Concern   Not on file  Social History Narrative   Not on file   Social Drivers of Health   Financial Resource Strain: Low Risk  (09/01/2023)   Overall Financial Resource Strain (CARDIA)    Difficulty of Paying Living Expenses: Not hard at all  Food Insecurity: No Food Insecurity (09/01/2023)   Hunger Vital Sign    Worried About Running Out of Food in the Last Year: Never true    Ran Out of Food in the Last Year: Never true  Transportation Needs: No Transportation Needs (09/01/2023)   PRAPARE - Administrator, Civil Service (Medical): No    Lack of Transportation (Non-Medical): No  Physical Activity: Insufficiently Active (09/01/2023)   Exercise Vital  Sign    Days of Exercise per Week: 4 days    Minutes of Exercise per Session: 30 min  Stress: No Stress Concern Present (09/01/2023)   [employer] of Occupational Health - Occupational Stress Questionnaire    Feeling of Stress : Not at all  Social Connections: Socially Integrated (09/01/2023)   Social Connection and Isolation Panel [NHANES]    Frequency of Communication with Friends and Family: More than three times a week    Frequency of Social Gatherings with Friends and Family: Three times a week    Attends Religious Services: More than 4 times per year    Active Member of Clubs or Organizations: Yes    Attends Banker Meetings: More than 4 times per year    Marital Status: Married  Careers information officer Violence: Not At Risk (09/01/2023)   Humiliation, Afraid, Rape, and Kick questionnaire    Fear of Current or Ex-Partner: No    Emotionally Abused: No    Physically Abused: No    Sexually Abused: No    Review of Systems: See HPI, otherwise negative ROS  Physical Exam: Vital signs in last 24 hours: Temp:  [98 F (36.7 C)] 98 F (36.7 C) (03/10 0751) Pulse Rate:  [65] 65 (03/10 0751) Resp:  [17] 17 (03/10 0751) BP: (157)/(89) 157/89 (03/10 0751) SpO2:  [99 %] 99 % (03/10 0751) Weight:  [72.6 kg] 72.6 kg (03/10 0751)   General:   Alert,  Well-developed, well-nourished, pleasant and cooperative in NAD Head:  Normocephalic and atraumatic. Eyes:  Sclera clear, no icterus.   Conjunctiva pink. Ears:  Normal auditory acuity. Nose:  No deformity, discharge,  or lesions. Msk:  Symmetrical without gross deformities. Normal posture. Extremities:  Without clubbing or edema. Neurologic:  Alert and  oriented x4;  grossly normal neurologically. Skin:  Intact without significant lesions or rashes. Psych:  Alert and cooperative. Normal mood and affect.  Impression/Plan: Felicia Daniel is here for a colonoscopy to be performed for surveillance purposes, personal history of adenomatous colon polyps in 2021  The risks of the procedure including infection, bleed, or perforation as well as benefits, limitations, alternatives and imponderables have been reviewed with the patient. Questions have been answered. All parties agreeable.

## 2024-03-02 ENCOUNTER — Encounter (HOSPITAL_COMMUNITY): Payer: Self-pay | Admitting: Internal Medicine

## 2024-03-02 LAB — SURGICAL PATHOLOGY

## 2024-07-07 ENCOUNTER — Ambulatory Visit
Admission: EM | Admit: 2024-07-07 | Discharge: 2024-07-07 | Disposition: A | Discharge status: Home / Self Care | Attending: Family Medicine | Admitting: Family Medicine

## 2024-07-07 ENCOUNTER — Encounter: Payer: Self-pay | Admitting: Emergency Medicine

## 2024-07-07 DIAGNOSIS — J069 Acute upper respiratory infection, unspecified: Secondary | ICD-10-CM

## 2024-07-07 LAB — POC SARS CORONAVIRUS 2 AG -  ED: SARS Coronavirus 2 Ag: NEGATIVE

## 2024-07-07 MED ORDER — PREDNISONE 20 MG PO TABS: 40.0000 mg | ORAL_TABLET | Freq: Every day | ORAL | 0 refills | Status: DC

## 2024-07-07 MED ORDER — AZELASTINE HCL 0.1 % NA SOLN: 1.0000 | Freq: Two times a day (BID) | NASAL | 0 refills | Status: DC

## 2024-07-07 MED ORDER — PROMETHAZINE-DM 6.25-15 MG/5ML PO SYRP: 5.0000 mL | ORAL_SOLUTION | Freq: Four times a day (QID) | ORAL | 0 refills | Status: DC | PRN

## 2024-07-07 NOTE — ED Provider Notes (Signed)
 [facility]    CSN: [id number] Arrival date & time: 07/07/24  1332      History   Chief Complaint No chief complaint on file.   HPI Felicia Daniel is a 63 y.o. female.   Patient presenting today with 5-day history of progressively worsening hacking cough, burning sensation with coughing, congestion, fatigue, headache.  Denies fever, chest pain, shortness of breath, abdominal pain, vomiting, diarrhea.  So far not trying anything over-the-counter for symptoms.  No known history of chronic pulmonary disease per patient.    Past Medical History:  Diagnosis Date   Anemia    iron deficency in young adult life.   Anxiety    Arthritis    Depression    Herpes    Papanicolaou smear of cervix with positive high risk human papilloma virus (HPV) test 06/18/2021   Repeat pap in 1 year     Patient Active Problem List   Diagnosis Date Noted   Encounter for well woman exam with routine gynecological exam 09/01/2023   Encounter for screening fecal occult blood testing 07/23/2022   Encounter for gynecological examination with Papanicolaou smear of cervix 07/23/2022   Posterior tibial tendon dysfunction (PTTD) of left lower extremity    Dysuria 08/13/2021   Eczema 07/12/2021   Herpes simplex 07/12/2021   Hypocalcemia 07/12/2021   Overweight 07/12/2021   Mixed hyperlipidemia 07/07/2021   PMB (postmenopausal bleeding) 06/18/2021   Papanicolaou smear of cervix with positive high risk human papilloma virus (HPV) test 06/18/2021   Generalized anxiety disorder 06/11/2021    Past Surgical History:  Procedure Laterality Date   ANKLE FUSION Left 02/2023   CHOLECYSTECTOMY     COLONOSCOPY WITH PROPOFOL  N/A 11/10/2020   Procedure: COLONOSCOPY WITH PROPOFOL ;  Surgeon: Cindie Carlin POUR, DO;  Location: [facility];  Service: Endoscopy;  Laterality: N/A;  7:30   COLONOSCOPY WITH PROPOFOL  N/A 03/01/2024   Procedure: COLONOSCOPY WITH PROPOFOL ;  Surgeon: Cindie Carlin POUR, DO;   Location: [facility];  Service: Endoscopy;  Laterality: N/A;  11:00am, asa 2   FOOT ARTHRODESIS Left 03/01/2022   Procedure: LEFT TALONAVICULAR AND SUBTALAR FUSION;  Surgeon: Harden Jerona GAILS, MD;  Location: [facility];  Service: Orthopedics;  Laterality: Left;   LEG SURGERY     POLYPECTOMY  11/10/2020   Procedure: POLYPECTOMY;  Surgeon: Cindie Carlin POUR, DO;  Location: [facility];  Service: Endoscopy;;   POLYPECTOMY  03/01/2024   Procedure: POLYPECTOMY;  Surgeon: Cindie Carlin POUR, DO;  Location: [facility];  Service: Endoscopy;;    OB History     Gravida  2   Para  2   Term  2   Preterm      AB      Living  0      SAB      IAB      Ectopic      Multiple      Live Births               Home Medications    Prior to Admission medications   Medication Sig Start Date End Date Taking? Authorizing Provider  azelastine  (ASTELIN ) 0.1 % nasal spray Place 1 spray into both nostrils 2 (two) times daily. Use in each nostril as directed 07/07/24  Yes Stuart Vernell Norris, PA-C  predniSONE  (DELTASONE ) 20 MG tablet Take 2 tablets (40 mg total) by mouth daily with breakfast. 07/07/24  Yes Stuart Vernell Norris, PA-C  promethazine -dextromethorphan (PROMETHAZINE -DM) 6.25-15 MG/5ML syrup Take  5 mLs by mouth 4 (four) times daily as needed. 07/07/24  Yes Stuart Vernell Norris, PA-C    Family History Family History  Problem Relation Age of Onset   Skin cancer Father    Breast cancer Mother    Peripheral vascular disease Mother    Drug abuse Daughter    Other Son        MVA    Social History Social History   Tobacco Use   Smoking status: Never   Smokeless tobacco: Never  Vaping Use   Vaping status: Never Used  Substance Use Topics   Alcohol use: Not Currently   Drug use: Never     Allergies   Dilaudid [hydromorphone]   Review of Systems Review of Systems Per HPI  Physical Exam Triage Vital Signs ED Triage Vitals  Encounter Vitals Group     BP  07/07/24 1339 (!) 143/86     Girls Systolic BP Percentile --      Girls Diastolic BP Percentile --      Boys Systolic BP Percentile --      Boys Diastolic BP Percentile --      Pulse Rate 07/07/24 1339 67     Resp 07/07/24 1339 16     Temp 07/07/24 1339 97.8 F (36.6 C)     Temp Source 07/07/24 1339 Oral     SpO2 07/07/24 1339 96 %     Weight --      Height --      Head Circumference --      Peak Flow --      Pain Score 07/07/24 1340 6     Pain Loc --      Pain Education --      Exclude from Growth Chart --    No data found.  Updated Vital Signs BP (!) 143/86 (BP Location: Right Arm)   Pulse 67   Temp 97.8 F (36.6 C) (Oral)   Resp 16   LMP 10/21/2016   SpO2 96%   Visual Acuity Right Eye Distance:   Left Eye Distance:   Bilateral Distance:    Right Eye Near:   Left Eye Near:    Bilateral Near:     Physical Exam Vitals and nursing note reviewed.  Constitutional:      Appearance: Normal appearance.  HENT:     Head: Atraumatic.     Right Ear: Tympanic membrane and external ear normal.     Left Ear: Tympanic membrane and external ear normal.     Nose: Rhinorrhea present.     Mouth/Throat:     Mouth: Mucous membranes are moist.     Pharynx: Posterior oropharyngeal erythema present.  Eyes:     Extraocular Movements: Extraocular movements intact.     Conjunctiva/sclera: Conjunctivae normal.  Cardiovascular:     Rate and Rhythm: Normal rate and regular rhythm.     Heart sounds: Normal heart sounds.  Pulmonary:     Effort: Pulmonary effort is normal.     Breath sounds: Normal breath sounds. No wheezing or rales.  Musculoskeletal:        General: Normal range of motion.     Cervical back: Normal range of motion and neck supple.  Skin:    General: Skin is warm and dry.  Neurological:     Mental Status: She is alert and oriented to person, place, and time.  Psychiatric:        Mood and Affect: Mood normal.  Thought Content: Thought content normal.       UC Treatments / Results  Labs (all labs ordered are listed, but only abnormal results are displayed) Labs Reviewed  POC SARS CORONAVIRUS 2 AG -  ED    EKG   Radiology No results found.  Procedures Procedures (including critical care time)  Medications Ordered in UC Medications - No data to display  Initial Impression / Assessment and Plan / UC Course  I have reviewed the triage vital signs and the nursing notes.  Pertinent labs & imaging results that were available during my care of the patient were reviewed by me and considered in my medical decision making (see chart for details).     Vital signs and exam very reassuring today, suspicious for viral respiratory infection possibly going into some bronchitis.  Rapid COVID-negative, will treat for bronchitis with prednisone , Phenergan  DM, Astelin  nasal spray, supportive over-the-counter medications and home care.  Return for worsening symptoms.  Final Clinical Impressions(s) / UC Diagnoses   Final diagnoses:  Viral URI with cough     Discharge Instructions      In addition to the prescribed medications, you may take Coricidin HBP, plain Mucinex, nasal saline, humidifiers, Tylenol     ED Prescriptions     Medication Sig Dispense Auth. Provider   predniSONE  (DELTASONE ) 20 MG tablet Take 2 tablets (40 mg total) by mouth daily with breakfast. 10 tablet Stuart Vernell Norris, PA-C   promethazine -dextromethorphan (PROMETHAZINE -DM) 6.25-15 MG/5ML syrup Take 5 mLs by mouth 4 (four) times daily as needed. 100 mL Stuart Vernell Norris, PA-C   azelastine  (ASTELIN ) 0.1 % nasal spray Place 1 spray into both nostrils 2 (two) times daily. Use in each nostril as directed 30 mL Stuart Vernell Norris, PA-C      PDMP not reviewed this encounter.   Stuart Vernell Norris, [place] 07/07/24 1452

## 2024-07-07 NOTE — ED Triage Notes (Signed)
 Cough, hurts to breath, feels fatigued and headache since Saturday.

## 2024-07-07 NOTE — Discharge Instructions (Signed)
 In addition to the prescribed medications, you may take Coricidin HBP, plain Mucinex, nasal saline, humidifiers, Tylenol 

## 2024-10-18 ENCOUNTER — Encounter: Payer: Self-pay | Admitting: Adult Health

## 2024-10-18 ENCOUNTER — Ambulatory Visit: Admitting: Adult Health

## 2024-10-18 VITALS — BP 149/88 | HR 68 | Ht 64.0 in | Wt 169.0 lb

## 2024-10-18 DIAGNOSIS — Z01419 Encounter for gynecological examination (general) (routine) without abnormal findings: Secondary | ICD-10-CM | POA: Diagnosis not present

## 2024-10-18 DIAGNOSIS — R030 Elevated blood-pressure reading, without diagnosis of hypertension: Secondary | ICD-10-CM | POA: Diagnosis not present

## 2024-10-18 DIAGNOSIS — Z1331 Encounter for screening for depression: Secondary | ICD-10-CM | POA: Diagnosis not present

## 2024-10-18 NOTE — Progress Notes (Signed)
 Patient ID: Felicia Daniel, female   DOB: 08-Jul-1961, 63 y.o.   MRN: [id number] History of Present Illness: Felicia Daniel is a 63 year old white female, married, PM in for a well woman gyn exam. She has no complaints and is still working.  PCP is Dr Shona.   Current Medications, Allergies, Past Medical History, Past Surgical History, Family History and Social History were reviewed in [employer] record.     Review of Systems: Patient denies any headaches, hearing loss, fatigue, blurred vision, shortness of breath, chest pain, abdominal pain, problems with bowel movements, urination, or intercourse.(Not active). No joint pain or mood swings.  Denies any vaginal bleeding   Physical Exam:BP (!) 149/88 (BP Location: Right Arm, Patient Position: Sitting, Cuff Size: Normal)   Pulse 68   Ht 5' 4 (1.626 m)   Wt 169 lb (76.7 kg)   LMP 10/21/2016   BMI 29.01 kg/m   General:  Well developed, well nourished, no acute distress Skin:  Warm and dry Neck:  Midline trachea, normal thyroid, good ROM, no lymphadenopathy, no carotid bruits heard Lungs; Clear to auscultation bilaterally Breast:  No dominant palpable mass, retraction, or nipple discharge Cardiovascular: Regular rate and rhythm Abdomen:  Soft, non tender, no hepatosplenomegaly Pelvic:  External genitalia is normal in appearance, no lesions.  The vagina is pale. Urethra has no lesions or masses. The cervix is smooth.  Uterus is felt to be normal size, shape, and contour.  No adnexal masses or tenderness noted.Bladder is non tender, no masses felt. Rectal: Deferred  Extremities/musculoskeletal:  No swelling or varicosities noted, no clubbing or cyanosis, has scar where had left ankle surgery Psych:  No mood changes, alert and cooperative,seems happy AA is 0 Fall risk is low    10/18/2024    1:42 PM 09/01/2023   10:41 AM 07/23/2022    9:20 AM  Depression screen PHQ 2/9  Decreased Interest 0 0 0  Down, Depressed, Hopeless 0 0  0  PHQ - 2 Score 0 0 0  Altered sleeping 0 0 0  Tired, decreased energy 0 0 0  Change in appetite 0 0 0  Feeling bad or failure about yourself  0 0 0  Trouble concentrating 0 0 1  Moving slowly or fidgety/restless 0 0 0  Suicidal thoughts 0 0 0  PHQ-9 Score 0 0 1       10/18/2024    1:42 PM 09/01/2023   10:42 AM 07/23/2022    9:20 AM 06/18/2021    8:56 AM  GAD 7 : Generalized Anxiety Score  Nervous, Anxious, on Edge 0 0 1 1  Control/stop worrying 1 0 0 0  Worry too much - different things 1 0 0 1  Trouble relaxing 1 0 0 1  Restless 0 0 1 0  Easily annoyed or irritable 1 0 1 1  Afraid - awful might happen 0 0 0 0  Total GAD 7 Score 4 0 3 4    Upstream - 10/18/24 1339       Pregnancy Intention Screening   Does the patient want to become pregnant in the next year? N/A    Does the patient's partner want to become pregnant in the next year? N/A    Would the patient like to discuss contraceptive options today? N/A      Contraception Wrap Up   Current Method Abstinence   PM   End Method Abstinence   PM   Contraception Counseling Provided No  Examination chaperoned by Clarita Salt LPN  Impression and plan: 1. Encounter for well woman exam with routine gynecological exam (Primary) Pap in 2026 Physical in 1 year Labs with PCP Mammogram was negative 12/29/23 Had colonoscopy 02/2024  2. Elevated BP without diagnosis of hypertension Keep check on BP and follow up with PCP

## 2024-12-06 ENCOUNTER — Other Ambulatory Visit (HOSPITAL_COMMUNITY): Payer: Self-pay | Admitting: Nurse Practitioner

## 2024-12-06 DIAGNOSIS — Z1231 Encounter for screening mammogram for malignant neoplasm of breast: Secondary | ICD-10-CM

## 2024-12-13 ENCOUNTER — Encounter (HOSPITAL_COMMUNITY): Payer: Self-pay | Admitting: *Deleted

## 2024-12-13 ENCOUNTER — Emergency Department (HOSPITAL_COMMUNITY): Admission: EM | Admit: 2024-12-13 | Discharge: 2024-12-13 | Disposition: A | Discharge status: Home / Self Care

## 2024-12-13 ENCOUNTER — Emergency Department (HOSPITAL_COMMUNITY)

## 2024-12-13 DIAGNOSIS — R0602 Shortness of breath: Secondary | ICD-10-CM | POA: Insufficient documentation

## 2024-12-13 DIAGNOSIS — R10A1 Flank pain, right side: Secondary | ICD-10-CM | POA: Diagnosis present

## 2024-12-13 LAB — URINALYSIS, W/ REFLEX TO CULTURE (INFECTION SUSPECTED)
Bacteria, UA: NONE SEEN
Bilirubin Urine: NEGATIVE
Glucose, UA: NEGATIVE mg/dL
Hgb urine dipstick: NEGATIVE
Ketones, ur: NEGATIVE mg/dL
Nitrite: NEGATIVE
Protein, ur: NEGATIVE mg/dL
Specific Gravity, Urine: 1.033 — ABNORMAL HIGH (ref 1.005–1.030)
pH: 7 (ref 5.0–8.0)

## 2024-12-13 LAB — CBC WITH DIFFERENTIAL/PLATELET
Abs Immature Granulocytes: 0.02 K/uL (ref 0.00–0.07)
Basophils Absolute: 0.1 K/uL (ref 0.0–0.1)
Basophils Relative: 1 %
Eosinophils Absolute: 0.2 K/uL (ref 0.0–0.5)
Eosinophils Relative: 2 %
HCT: 41.8 % (ref 36.0–46.0)
Hemoglobin: 14 g/dL (ref 12.0–15.0)
Immature Granulocytes: 0 %
Lymphocytes Relative: 23 %
Lymphs Abs: 2 K/uL (ref 0.7–4.0)
MCH: 28.6 pg (ref 26.0–34.0)
MCHC: 33.5 g/dL (ref 30.0–36.0)
MCV: 85.5 fL (ref 80.0–100.0)
Monocytes Absolute: 0.7 K/uL (ref 0.1–1.0)
Monocytes Relative: 8 %
Neutro Abs: 5.7 K/uL (ref 1.7–7.7)
Neutrophils Relative %: 66 %
Platelets: 202 K/uL (ref 150–400)
RBC: 4.89 MIL/uL (ref 3.87–5.11)
RDW: 13.2 % (ref 11.5–15.5)
WBC: 8.6 K/uL (ref 4.0–10.5)
nRBC: 0 % (ref 0.0–0.2)

## 2024-12-13 LAB — BASIC METABOLIC PANEL WITH GFR
Anion gap: 14 (ref 5–15)
BUN: 10 mg/dL (ref 8–23)
CO2: 21 mmol/L — ABNORMAL LOW (ref 22–32)
Calcium: 8.7 mg/dL — ABNORMAL LOW (ref 8.9–10.3)
Chloride: 106 mmol/L (ref 98–111)
Creatinine, Ser: 0.72 mg/dL (ref 0.44–1.00)
GFR, Estimated: >60 mL/min
Glucose, Bld: 97 mg/dL (ref 70–99)
Potassium: 3.8 mmol/L (ref 3.5–5.1)
Sodium: 141 mmol/L (ref 135–145)

## 2024-12-13 LAB — HEPATIC FUNCTION PANEL
ALT: 31 U/L (ref 0–44)
AST: 27 U/L (ref 15–41)
Albumin: 4.2 g/dL (ref 3.5–5.0)
Alkaline Phosphatase: 94 U/L (ref 38–126)
Bilirubin, Direct: 0.2 mg/dL (ref 0.0–0.2)
Indirect Bilirubin: 0.4 mg/dL (ref 0.3–0.9)
Total Bilirubin: 0.6 mg/dL (ref 0.0–1.2)
Total Protein: 7.8 g/dL (ref 6.5–8.1)

## 2024-12-13 MED ORDER — OXYCODONE HCL 5 MG PO TABS: 5.0000 mg | ORAL_TABLET | Freq: Four times a day (QID) | ORAL | 0 refills | Status: AC | PRN

## 2024-12-13 MED ORDER — FENTANYL CITRATE (PF) 100 MCG/2ML IJ SOLN
100.0000 ug | Freq: Once | INTRAMUSCULAR | Status: AC
  Administered 2024-12-13: 100 ug via INTRAVENOUS
  Filled 2024-12-13: qty 2

## 2024-12-13 MED ORDER — LACTATED RINGERS IV BOLUS
500.0000 mL | Freq: Once | INTRAVENOUS | Status: AC
  Administered 2024-12-13: 500 mL via INTRAVENOUS

## 2024-12-13 MED ORDER — IOHEXOL 350 MG/ML SOLN
100.0000 mL | Freq: Once | INTRAVENOUS | Status: AC | PRN
  Administered 2024-12-13: 100 mL via INTRAVENOUS

## 2024-12-13 MED ORDER — ONDANSETRON HCL 4 MG PO TABS: 4.0000 mg | ORAL_TABLET | Freq: Three times a day (TID) | ORAL | 0 refills | Status: AC | PRN

## 2024-12-13 MED ORDER — ONDANSETRON HCL 4 MG/2ML IJ SOLN
4.0000 mg | Freq: Once | INTRAMUSCULAR | Status: AC
  Administered 2024-12-13: 4 mg via INTRAVENOUS
  Filled 2024-12-13: qty 2

## 2024-12-13 NOTE — ED Triage Notes (Signed)
 Pt c/o sudden right flank pain to abdomen  Pt states the pain is so severe it is hard to take a deep breath

## 2024-12-13 NOTE — Discharge Instructions (Signed)
 Alternate Tylenol  and Motrin every 3 hours as needed for pain.  Use your oxycodone  for severe pain.  Drink lots of fluids.  Follow-up with your primary care doctor or urology as needed.  Return to the ER for any new or worsening symptoms.

## 2024-12-13 NOTE — ED Provider Notes (Signed)
 "  EMERGENCY DEPARTMENT AT [facility] Provider Note   CSN: [id number] Arrival date & time: 12/13/24  0710     Patient presents with: Flank Pain   Felicia Daniel is a 63 y.o. female.   63 year old female presents for evaluation of right flank pain.  States it started suddenly last night.  States it radiates all the way to the middle of her back and hurts to take a deep breath.  She appears very uncomfortable, is not laying down on the bed and quite agitated.  Denies any history of kidney stones.  Denies any dysuria, urinary frequency, chest pain, vomiting, diarrhea, or any other symptoms or concerns   Flank Pain Associated symptoms include shortness of breath. Pertinent negatives include no chest pain and no abdominal pain.       Prior to Admission medications  Medication Sig Start Date End Date Taking? Authorizing Provider  ondansetron  (ZOFRAN ) 4 MG tablet Take 1 tablet (4 mg total) by mouth every 8 (eight) hours as needed for up to 4 days. 12/13/24 12/17/24 Yes Ramzi Brathwaite L, DO  oxyCODONE  (ROXICODONE ) 5 MG immediate release tablet Take 1 tablet (5 mg total) by mouth every 6 (six) hours as needed for up to 4 days for severe pain (pain score 7-10). 12/13/24 12/17/24 Yes Jamariya Davidoff L, DO  AIRSUPRA 90-80 MCG/ACT AERO SMARTSIG:2 inhalation Via Inhaler 4 Times Daily PRN 07/08/24   [provider]  ALPRAZolam (XANAX) 0.5 MG tablet TAKE 1 TABLET BY MOUTH TWICE A DAY AS NEEDED FOR ANXIETY    [provider]  valACYclovir (VALTREX) 1000 MG tablet Take 1,000 mg by mouth daily.    [provider]    Allergies: Dilaudid [hydromorphone]    Review of Systems  Constitutional:  Negative for chills and fever.  HENT:  Negative for ear pain and sore throat.   Eyes:  Negative for pain and visual disturbance.  Respiratory:  Positive for shortness of breath. Negative for cough.   Cardiovascular:  Negative for chest pain and palpitations.   Gastrointestinal:  Negative for abdominal pain and vomiting.  Genitourinary:  Positive for flank pain. Negative for dysuria and hematuria.  Musculoskeletal:  Negative for arthralgias and back pain.  Skin:  Negative for color change and rash.  Neurological:  Negative for seizures and syncope.  All other systems reviewed and are negative.   Updated Vital Signs BP (!) 153/84 (BP Location: Left Arm)   Pulse 70   Temp 97.7 F (36.5 C) (Oral)   Resp 14   Ht 5' 4 (1.626 m)   Wt 68 kg   LMP 10/21/2016   SpO2 97%   BMI 25.75 kg/m   Physical Exam Vitals and nursing note reviewed.  Constitutional:      General: She is in acute distress.     Appearance: Normal appearance. She is well-developed. She is ill-appearing.  HENT:     Head: Normocephalic and atraumatic.  Eyes:     Conjunctiva/sclera: Conjunctivae normal.  Cardiovascular:     Rate and Rhythm: Normal rate and regular rhythm.     Heart sounds: No murmur heard. Pulmonary:     Effort: Pulmonary effort is normal. No respiratory distress.     Breath sounds: Normal breath sounds.  Abdominal:     Palpations: Abdomen is soft.     Tenderness: There is no abdominal tenderness. There is no right CVA tenderness or left CVA tenderness.  Musculoskeletal:        General: No  swelling.     Cervical back: Neck supple.  Skin:    General: Skin is warm and dry.     Capillary Refill: Capillary refill takes less than 2 seconds.  Neurological:     Mental Status: She is alert.  Psychiatric:        Mood and Affect: Mood normal.     (all labs ordered are listed, but only abnormal results are displayed) Labs Reviewed  URINALYSIS, W/ REFLEX TO CULTURE (INFECTION SUSPECTED) - Abnormal; Notable for the following components:      Result Value   Specific Gravity, Urine 1.033 (*)    Leukocytes,Ua LARGE (*)    All other components within normal limits  BASIC METABOLIC PANEL WITH GFR - Abnormal; Notable for the following components:   CO2 21  (*)    Calcium 8.7 (*)    All other components within normal limits  CBC WITH DIFFERENTIAL/PLATELET  HEPATIC FUNCTION PANEL    EKG: None  Radiology: CT Angio Chest/Abd/Pel for Dissection W and/or Wo Contrast Result Date: 12/13/2024 CLINICAL DATA:  Right flank, abdominal, and back pain. Left flank pain. Rule out dissection. EXAM: CT ANGIOGRAPHY CHEST, ABDOMEN AND PELVIS TECHNIQUE: Non-contrast CT of the chest was initially obtained. Multidetector CT imaging through the chest, abdomen and pelvis was performed using the standard protocol during bolus administration of intravenous contrast. Multiplanar reconstructed images and MIPs were obtained and reviewed to evaluate the vascular anatomy. RADIATION DOSE REDUCTION: This exam was performed according to the departmental dose-optimization program which includes automated exposure control, adjustment of the mA and/or kV according to patient size and/or use of iterative reconstruction technique. CONTRAST:  OMNIPAQUE  IOHEXOL  350 MG/ML SOLN COMPARISON:  07/25/2019. FINDINGS: CTA CHEST FINDINGS Cardiovascular: The heart is normal in size and there is no pericardial effusion. A few scattered coronary artery calcifications are noted. There is atherosclerotic calcification of the aorta without evidence of aneurysm or dissection. The pulmonary trunk is normal in caliber. Mediastinum/Nodes: No mediastinal, hilar, or axillary lymphadenopathy is seen. Hypodensities are noted in the thyroid gland, the largest measuring 1.1 cm on the right. No additional imaging is recommended. The trachea and esophagus are within normal limits. There is a small hiatal hernia. Lungs/Pleura: Centrilobular emphysematous changes are present in the lungs. No consolidation, effusion, or pneumothorax is seen. Musculoskeletal: Degenerative changes are present in the thoracic spine. No acute osseous abnormality. Review of the MIP images confirms the above findings. CTA ABDOMEN AND PELVIS  FINDINGS VASCULAR Aorta: Normal caliber aorta without aneurysm, dissection, vasculitis or significant stenosis. Aortic atherosclerosis. Celiac: Patent without evidence of aneurysm, dissection, vasculitis or significant stenosis. SMA: Patent without evidence of aneurysm, dissection, vasculitis or significant stenosis. Renals: Both renal arteries are patent without evidence of aneurysm, dissection, vasculitis, fibromuscular dysplasia or significant stenosis. IMA: Patent. Inflow: Patent without evidence of aneurysm, dissection, vasculitis or significant stenosis. Veins: No obvious venous abnormality within the limitations of this arterial phase study. Review of the MIP images confirms the above findings. NON-VASCULAR Hepatobiliary: No focal liver abnormality is seen. Status post cholecystectomy. No biliary dilatation. Pancreas: Unremarkable. No pancreatic ductal dilatation or surrounding inflammatory changes. Spleen: Normal in size without focal abnormality. Adrenals/Urinary Tract: The adrenal glands are within normal limits. The kidneys enhance symmetrically. No renal calculus or hydronephrosis bilaterally. The bladder is unremarkable. Stomach/Bowel: There is a small hiatal hernia. The stomach is otherwise within normal limits. No bowel obstruction, free air, or pneumatosis is seen. A few scattered diverticula are present along the colon without evidence of  diverticulitis. Appendix appears normal. Lymphatic: No abdominal or pelvic lymphadenopathy. Reproductive: Uterus and bilateral adnexa are unremarkable. Other: No abdominopelvic ascites. A small fat containing umbilical hernia is noted. Musculoskeletal: Degenerative changes are present in the lumbar spine. No acute osseous abnormality. Review of the MIP images confirms the above findings. IMPRESSION: 1. Aortic atherosclerosis without evidence of aneurysm or dissection. 2. Small hiatal hernia. 3. Diverticulosis without diverticulitis. 4. Coronary artery  calcifications. 5. Emphysema. Electronically Signed   By: Leita Birmingham M.D.   On: 12/13/2024 10:58     Procedures   Medications Ordered in the ED  fentaNYL  (SUBLIMAZE ) injection 100 mcg (100 mcg Intravenous Given 12/13/24 0739)  ondansetron  (ZOFRAN ) injection 4 mg (4 mg Intravenous Given 12/13/24 0747)  lactated ringers  bolus 500 mL (500 mLs Intravenous Bolus 12/13/24 0747)  iohexol  (OMNIPAQUE ) 350 MG/ML injection 100 mL (100 mLs Intravenous Contrast Given 12/13/24 1011)  fentaNYL  (SUBLIMAZE ) injection 100 mcg (100 mcg Intravenous Given 12/13/24 1054)                                    Medical Decision Making Cardiac monitor interpretation: Sinus rhythm, no ectopy  Patient here for right flank pain.  CT angiogram of the chest abdomen pelvis shows no evidence of dissection and no acute abnormality.  She is feeling better after fentanyl .  Lab work is fairly unremarkable.  I think she may have a kidney stone.  Will give her a dose of Oxy to use as needed at home and was given Toradol  here as well.  She was given IV fluids and Zofran .  Advise close follow-up with primary care, to strain her urine and return to the ER for new or worsening symptoms.  Advised Tylenol  Motrin as needed for pain.  She feels comfortable to plan to be discharged home.  All results and plan discussed with patient and family at bedside.  Problems Addressed: Right flank pain: acute illness or injury  Amount and/or Complexity of Data Reviewed External Data Reviewed: notes.    Details: Prior urgent care records reviewed and patient seen 07-07-2024 for viral URI Labs: ordered. Decision-making details documented in ED Course.    Details: Ordered and reviewed by me and unremarkable Radiology: ordered and independent interpretation performed. Decision-making details documented in ED Course.    Details: Ordered and interpreted by me independently of radiology CT angiogram of the chest abdomen pelvis: Shows no acute  abnormality ECG/medicine tests: ordered and independent interpretation performed. Decision-making details documented in ED Course.    Details: Ordered and interpreted by me in the absence of cardiology and shows sinus rhythm, no STEMI, or significant change when compared to prior EKG  Risk OTC drugs. Prescription drug management. Parenteral controlled substances. Drug therapy requiring intensive monitoring for toxicity.    Final diagnoses:  Right flank pain    ED Discharge Orders          Ordered    oxyCODONE  (ROXICODONE ) 5 MG immediate release tablet  Every 6 hours PRN        12/13/24 1125    ondansetron  (ZOFRAN ) 4 MG tablet  Every 8 hours PRN        12/13/24 1143               Bristyl Mclees L, DO 12/13/24 1443  "

## 2024-12-22 ENCOUNTER — Other Ambulatory Visit (HOSPITAL_COMMUNITY): Payer: Self-pay | Admitting: Nurse Practitioner

## 2024-12-22 DIAGNOSIS — R946 Abnormal results of thyroid function studies: Secondary | ICD-10-CM

## 2024-12-29 ENCOUNTER — Ambulatory Visit (HOSPITAL_COMMUNITY)
Admission: RE | Admit: 2024-12-29 | Discharge: 2024-12-29 | Disposition: A | Discharge status: Home / Self Care | Source: Ambulatory Visit | Attending: Nurse Practitioner | Admitting: Nurse Practitioner

## 2024-12-29 DIAGNOSIS — R946 Abnormal results of thyroid function studies: Secondary | ICD-10-CM | POA: Insufficient documentation

## 2025-01-10 ENCOUNTER — Ambulatory Visit (HOSPITAL_COMMUNITY)
Admission: RE | Admit: 2025-01-10 | Discharge: 2025-01-10 | Disposition: A | Source: Ambulatory Visit | Attending: Nurse Practitioner | Admitting: Nurse Practitioner

## 2025-01-10 DIAGNOSIS — Z1231 Encounter for screening mammogram for malignant neoplasm of breast: Secondary | ICD-10-CM | POA: Diagnosis present
